# Patient Record
Sex: Female | Born: 1995 | Race: Black or African American | Hispanic: No | Marital: Single | Smoking: Never smoker
Health system: Southern US, Community
[De-identification: ages and names within clinical notes are randomized; demographics above are authoritative.]

## PROBLEM LIST (undated history)

## (undated) DIAGNOSIS — E119 Type 2 diabetes mellitus without complications: Secondary | ICD-10-CM

## (undated) HISTORY — DX: Type 2 diabetes mellitus without complications: E11.9

## (undated) HISTORY — PX: CHOLECYSTECTOMY: SHX55

---

## 2016-10-21 ENCOUNTER — Encounter (HOSPITAL_COMMUNITY): Payer: Self-pay | Admitting: Emergency Medicine

## 2016-10-21 ENCOUNTER — Ambulatory Visit (HOSPITAL_COMMUNITY)
Admission: EM | Admit: 2016-10-21 | Discharge: 2016-10-21 | Disposition: A | Discharge status: Home / Self Care | Payer: BLUE CROSS/BLUE SHIELD | Attending: Family | Admitting: Family

## 2016-10-21 DIAGNOSIS — B9689 Other specified bacterial agents as the cause of diseases classified elsewhere: Secondary | ICD-10-CM | POA: Insufficient documentation

## 2016-10-21 DIAGNOSIS — N760 Acute vaginitis: Secondary | ICD-10-CM | POA: Diagnosis not present

## 2016-10-21 DIAGNOSIS — R1030 Lower abdominal pain, unspecified: Secondary | ICD-10-CM | POA: Diagnosis present

## 2016-10-21 DIAGNOSIS — Z3202 Encounter for pregnancy test, result negative: Secondary | ICD-10-CM | POA: Diagnosis not present

## 2016-10-21 DIAGNOSIS — R102 Pelvic and perineal pain: Secondary | ICD-10-CM

## 2016-10-21 LAB — POCT URINALYSIS DIP (DEVICE)
BILIRUBIN URINE: NEGATIVE
Glucose, UA: NEGATIVE mg/dL
Ketones, ur: NEGATIVE mg/dL
NITRITE: NEGATIVE
PH: 5.5 (ref 5.0–8.0)
Protein, ur: NEGATIVE mg/dL
UROBILINOGEN UA: 0.2 mg/dL (ref 0.0–1.0)

## 2016-10-21 LAB — POCT PREGNANCY, URINE: Preg Test, Ur: NEGATIVE

## 2016-10-21 NOTE — ED Triage Notes (Signed)
The patient presented to the [facility] with a complaint of lower abdominal pain that started after the oral treatment of chlamydia. The patient reported that she is also currently taking an oral antibiotic for a pelvic abscess.

## 2016-10-21 NOTE — Discharge Instructions (Signed)
As discussed, suprapubic pain is nonspecific at this point. It is bilateral, as discussed as well, which makes me think could be polycystic ovarian syndrome which typically cyst son ovaries enlarge during menstrual cycle and become painful.  Please closely monitor symptom as discussed, if worsens in anyway or new symptoms develop, please go to  emergency room. Otherwise, please follow-up with your health clinic as discussed likely transvaginal ultrasound would be beneficial for you. I would prefer Tylenol is fine. Heat will also help symptoms as well.

## 2016-10-21 NOTE — ED Provider Notes (Signed)
[name]    CSN: [id number] Arrival date & time: 10/21/16  1222     History   Chief Complaint Chief Complaint  Patient presents with  . Abdominal Pain    HPI Michelle Lawson is a 21 y.o. female.   Chief complaint of lower abdominal pain x 3 days , worsening. Treated for chlamydia after positive at [facility] 3 days ago , treated with one time dose medication. ( unsure of name) . Pain worsened with walking. Nothing makes it better. Pain is equal on both sides of abdomen. Cramps with cycle normally in low back.   Had dysuria 3 days ago,  However thimks from chlamydia. ' which has gotten better'. No vomiting, diarrhea, fever,  Hematuria, blood in stool. Rates pain 7/10. Last BM 2 days ago. Passing gas.   ON menstrual cycle currently, started cycle about 6 days ago and cycle lasts one week. On Nuvaring.   No concerns for pregnancy. No new sexual encounters since tested at health clinic.   She also reports a cyst in vagina area which she's taking Keflex for.  Suspected PCOS years ago; patient denies the ultrasound for confirmation        History reviewed. No pertinent past medical history.  There are no active problems to display for this patient.   History reviewed. No pertinent surgical history.  OB History    No data available       Home Medications    Prior to Admission medications   Medication Sig Start Date End Date Taking? Authorizing Provider  cephALEXin (KEFLEX) 500 MG capsule Take 500 mg by mouth 4 (four) times daily.   Yes [provider]    Family History History reviewed. No pertinent family history.  Social History Social History  Substance Use Topics  . Smoking status: Never Smoker  . Smokeless tobacco: Never Used  . Alcohol use No     Allergies   Patient has no known allergies.   Review of Systems Review of Systems  Constitutional: Negative for chills and fever.  Respiratory: Negative for cough.     Cardiovascular: Negative for chest pain and palpitations.  Gastrointestinal: Positive for abdominal pain. Negative for abdominal distention, constipation, diarrhea, nausea and vomiting.  Genitourinary: Positive for vaginal bleeding. Negative for dysuria, frequency, hematuria, pelvic pain, vaginal discharge and vaginal pain.     Physical Exam Triage Vital Signs ED Triage Vitals [10/21/16 1402]  Enc Vitals Group     BP 131/80     Pulse Rate 73     Resp 16     Temp 98.3 F (36.8 C)     Temp Source Oral     SpO2 100 %     Weight      Height      Head Circumference      Peak Flow      Pain Score      Pain Loc      Pain Edu?      Excl. in [place]?    No data found.   Updated Vital Signs BP 131/80 (BP Location: Left Arm)   Pulse 73   Temp 98.3 F (36.8 C) (Oral)   Resp 16   LMP 10/21/2016 (Exact Date)   SpO2 100%   Visual Acuity Right Eye Distance:   Left Eye Distance:   Bilateral Distance:    Right Eye Near:   Left Eye Near:    Bilateral Near:     Physical Exam  Constitutional:  She appears well-developed and well-nourished.  Eyes: Conjunctivae are normal.  Cardiovascular: Normal rate, regular rhythm, normal heart sounds and normal pulses.   Pulmonary/Chest: Effort normal and breath sounds normal. She has no wheezes. She has no rhonchi. She has no rales.  Abdominal: Soft. Normal appearance and bowel sounds are normal. She exhibits no distension, no fluid wave, no ascites and no mass. There is no tenderness. There is no rigidity, no rebound, no guarding and no CVA tenderness.    Focal areas of tenderness as noted by patient and also appreciated during exam. Mild tenderness bilateral suprapubic areas as marked on diagram.   Genitourinary: There is no rash, tenderness or lesion on the right labia. There is no rash, tenderness or lesion on the left labia. Cervix exhibits no motion tenderness and no discharge. Right adnexum displays no mass, no tenderness and no fullness.  Left adnexum displays no mass, no tenderness and no fullness. There is bleeding in the vagina. No erythema or tenderness in the vagina. No foreign body in the vagina. No vaginal discharge found.  Genitourinary Comments: No vulvovaginal erythema. No lesions or evidence of abscess.  Discharge is thin and clear, not purulent. Blood seen coming from os.   Neurological: She is alert.  Skin: Skin is warm and dry.  Psychiatric: She has a normal mood and affect. Her speech is normal and behavior is normal. Thought content normal.  Vitals reviewed.    UC Treatments / Results  Labs (all labs ordered are listed, but only abnormal results are displayed) Labs Reviewed  CERVICOVAGINAL ANCILLARY ONLY    EKG  EKG Interpretation None       Radiology No results found.  Procedures Procedures (including critical care time)  Medications Ordered in UC Medications - No data to display   Initial Impression / Assessment and Plan / UC Course  I have reviewed the triage vital signs and the nursing notes.  Pertinent labs & imaging results that were available during my care of the patient were reviewed by me and considered in my medical decision making (see chart for details).      Final Clinical Impressions(s) / UC Diagnoses   Final diagnoses:  Suprapubic pain  Patient is afebrile and well-appearing. She is talking easily, no facial grimacing, she is using her phone while in the room. No obvious acute pain. Reassured by benign abdominal exam. Pain actually feels quite superficial as able to be elicited on bilateral sides of the groin area as noted on diagram. No CMT. Discussed with patient etiology pain is not specific at this point. However I suspect since it is bilateral pain may be from menstrual cramping, ovarian in nature or muscle skeletal etiology. Advised patient to go to emergency room if she thought her pain was severe, since rated pain 7/10 today;  patient politely declined at this time  and prefers to wait  to see her health clinic next week. Advised her when she follows up with the health clinic, an ultrasound would be appropriate next step to evaluate for PCOS. Patient verbalized understanding of this. Return precautions given.  Of note, call patient after she left to  discuss results of negative urine pregancy test and also blood seen in urine dipstick as well as leukocytes. Patient is not having dysuria, urinary frequency at this time, patient felt more comfortable with following up with her health team at [facility] to have urine studies performed including urine culture. She declines any antibiotic this evening in the absence of symptoms  which I advised appropriate.   New Prescriptions New Prescriptions   No medications on file     Controlled Substance Prescriptions [place] Controlled Substance Registry consulted? Not Applicable   Burnard Hawthorne,  10/21/16 [phone]

## 2016-10-23 LAB — CERVICOVAGINAL ANCILLARY ONLY
BACTERIAL VAGINITIS: POSITIVE — AB
CANDIDA VAGINITIS: NEGATIVE
Chlamydia: NEGATIVE
Neisseria Gonorrhea: NEGATIVE
Trichomonas: NEGATIVE

## 2016-10-24 ENCOUNTER — Other Ambulatory Visit: Payer: Self-pay | Admitting: Family

## 2016-10-24 DIAGNOSIS — B9689 Other specified bacterial agents as the cause of diseases classified elsewhere: Secondary | ICD-10-CM

## 2016-10-24 DIAGNOSIS — N760 Acute vaginitis: Principal | ICD-10-CM

## 2016-10-24 MED ORDER — METRONIDAZOLE 500 MG PO TABS: 500.0000 mg | ORAL_TABLET | Freq: Two times a day (BID) | ORAL | 0 refills | Status: DC

## 2017-07-10 ENCOUNTER — Ambulatory Visit (HOSPITAL_COMMUNITY)
Admission: EM | Admit: 2017-07-10 | Discharge: 2017-07-10 | Disposition: A | Discharge status: Home / Self Care | Payer: BLUE CROSS/BLUE SHIELD | Attending: Family Medicine | Admitting: Family Medicine

## 2017-07-10 ENCOUNTER — Other Ambulatory Visit: Payer: Self-pay

## 2017-07-10 ENCOUNTER — Encounter (HOSPITAL_COMMUNITY): Payer: Self-pay | Admitting: Emergency Medicine

## 2017-07-10 DIAGNOSIS — R109 Unspecified abdominal pain: Secondary | ICD-10-CM | POA: Diagnosis present

## 2017-07-10 DIAGNOSIS — Z79899 Other long term (current) drug therapy: Secondary | ICD-10-CM | POA: Insufficient documentation

## 2017-07-10 DIAGNOSIS — R197 Diarrhea, unspecified: Secondary | ICD-10-CM

## 2017-07-10 DIAGNOSIS — Z833 Family history of diabetes mellitus: Secondary | ICD-10-CM | POA: Insufficient documentation

## 2017-07-10 DIAGNOSIS — Z823 Family history of stroke: Secondary | ICD-10-CM | POA: Diagnosis not present

## 2017-07-10 NOTE — ED Provider Notes (Signed)
[place]    CSN: [id number] Arrival date & time: 07/10/17  1023     History   Chief Complaint Chief Complaint  Patient presents with  . Abdominal Pain    HPI Michelle Lawson is a 22 y.o. female.   HPI  Patient is here for diarrhea for 2-1/2 weeks.  She states is been present every day.  Every time she eats she has to probably go to the bathroom.  Does not matter what she eats.  She did not start off with any stomach flu.  She did travel recently to [place].  She was at her home visiting family.  No one else at home or at her current place of residence is sick.  She denied any food that she thought was contaminated.  She does not have any food allergies.  She denies any other travel.  She is had no nausea or vomiting.  Her weight is been stable.  She is drinking plenty of fluids.  She states the stool is loose to watery.  No mucus or blood.  No recent antibiotic use.  History reviewed. No pertinent past medical history.  There are no active problems to display for this patient.   History reviewed. No pertinent surgical history.  OB History   None      Home Medications    Prior to Admission medications   Medication Sig Start Date End Date Taking? Authorizing Provider  FLUoxetine (PROZAC) 40 MG capsule Take 40 mg by mouth daily.   Yes [provider]    Family History Family History  Problem Relation Age of Onset  . Diabetes Mother   . Stroke Mother   . Diabetes Father     Social History Social History   Tobacco Use  . Smoking status: Never Smoker  . Smokeless tobacco: Never Used  Substance Use Topics  . Alcohol use: No  . Drug use: No     Allergies   Patient has no known allergies.   Review of Systems Review of Systems  Constitutional: Negative for chills and fever.  HENT: Negative for ear pain and sore throat.   Eyes: Negative for pain and visual disturbance.  Respiratory: Negative for cough and shortness of breath.     Cardiovascular: Negative for chest pain and palpitations.  Gastrointestinal: Positive for abdominal pain and diarrhea. Negative for blood in stool, nausea and vomiting.  Genitourinary: Negative for dysuria and hematuria.  Musculoskeletal: Negative for arthralgias and back pain.  Skin: Negative for color change and rash.  Neurological: Negative for seizures and syncope.  All other systems reviewed and are negative.    Physical Exam Triage Vital Signs ED Triage Vitals  Enc Vitals Group     BP 07/10/17 1127 125/77     Pulse Rate 07/10/17 1127 67     Resp 07/10/17 1127 18     Temp 07/10/17 1127 100.1 F (37.8 C)     Temp Source 07/10/17 1127 Oral     SpO2 07/10/17 1127 98 %     Weight --      Height --      Head Circumference --      Peak Flow --      Pain Score 07/10/17 1124 2     Pain Loc --      Pain Edu? --      Excl. in [place]? --    No data found.  Updated Vital Signs BP 125/77 (BP Location: Left Arm) Comment (BP Location):  large cuff  Pulse 67   Temp 100.1 F (37.8 C) (Oral)   Resp 18   LMP 06/24/2017   SpO2 98%   Visual Acuity Right Eye Distance:   Left Eye Distance:   Bilateral Distance:    Right Eye Near:   Left Eye Near:    Bilateral Near:     Physical Exam  Constitutional: She appears well-developed and well-nourished. No distress.  HENT:  Head: Normocephalic and atraumatic.  Mouth/Throat: Oropharynx is clear and moist.  Eyes: Pupils are equal, round, and reactive to light. Conjunctivae are normal.  Neck: Normal range of motion.  Cardiovascular: Normal rate and normal heart sounds.  Pulmonary/Chest: Effort normal and breath sounds normal. No respiratory distress.  Abdominal: Soft. Normal appearance and bowel sounds are normal. She exhibits no distension. There is no hepatosplenomegaly. There is no tenderness.  Musculoskeletal: Normal range of motion. She exhibits no edema.  Neurological: She is alert.  Skin: Skin is warm and dry.  Psychiatric: She  has a normal mood and affect. Her behavior is normal.     UC Treatments / Results  Labs (all labs ordered are listed, but only abnormal results are displayed) Labs Reviewed  GASTROINTESTINAL PANEL BY PCR, STOOL (REPLACES STOOL CULTURE)    EKG None  Radiology No results found.  Procedures Procedures (including critical care time)  Medications Ordered in UC Medications - No data to display  Initial Impression / Assessment and Plan / UC Course  I have reviewed the triage vital signs and the nursing notes.  Pertinent labs & imaging results that were available during my care of the patient were reviewed by me and considered in my medical decision making (see chart for details).     Discussed diarrhea.  This is presumed infectious given the longevity.  She has tried bland diet.  She is try to increase her fluids.  She has not used any over-the-counter products.  We will do a pathogen profile.  Instructed on dietary restrictions and diarrhea management. Final Clinical Impressions(s) / UC Diagnoses   Final diagnoses:  Diarrhea of presumed infectious origin     Discharge Instructions     Drink plenty of fluids Bland diet, advance as tolerated. We did lab testing during this visit.  If there are any abnormal findings that require change in medicine or indicate a positive result, you will be notified.  If all of your tests are normal, you will not be called.   Most causes of diarrhea will resolve with conservative treatment You may use diarrhea medicine like kao pectate, imodium or pepto bismol if needed for symptom control Return if diarrhea persists or worsens.    ED Prescriptions    None     Controlled Substance Prescriptions [place] Controlled Substance Registry consulted? Not Applicable   Raylene Everts, MD 07/10/17 1845

## 2017-07-10 NOTE — ED Triage Notes (Addendum)
Poor appetite, food has no taste, lethargy, frequent napping and diarrhea (4 times a day).  Onset around may 21 of symptoms.    Patient did fly within country to [facility] the time of onset of symptoms

## 2017-07-10 NOTE — Discharge Instructions (Addendum)
Drink plenty of fluids Bland diet, advance as tolerated. We did lab testing during this visit.  If there are any abnormal findings that require change in medicine or indicate a positive result, you will be notified.  If all of your tests are normal, you will not be called.   Most causes of diarrhea will resolve with conservative treatment You may use diarrhea medicine like kao pectate, imodium or pepto bismol if needed for symptom control Return if diarrhea persists or worsens.

## 2017-07-11 LAB — GASTROINTESTINAL PANEL BY PCR, STOOL (REPLACES STOOL CULTURE)

## 2017-07-13 ENCOUNTER — Telehealth (HOSPITAL_COMMUNITY): Payer: Self-pay

## 2017-07-13 NOTE — Telephone Encounter (Signed)
Results are within normal range. Pt contacted and made aware. Verbalized understanding.   

## 2017-12-02 ENCOUNTER — Emergency Department (HOSPITAL_COMMUNITY): Payer: BLUE CROSS/BLUE SHIELD

## 2017-12-02 ENCOUNTER — Other Ambulatory Visit: Payer: Self-pay

## 2017-12-02 ENCOUNTER — Emergency Department (HOSPITAL_COMMUNITY)
Admission: EM | Admit: 2017-12-02 | Discharge: 2017-12-02 | Disposition: A | Discharge status: Home / Self Care | Payer: BLUE CROSS/BLUE SHIELD | Attending: Emergency Medicine | Admitting: Emergency Medicine

## 2017-12-02 DIAGNOSIS — J40 Bronchitis, not specified as acute or chronic: Secondary | ICD-10-CM | POA: Diagnosis not present

## 2017-12-02 DIAGNOSIS — Z79899 Other long term (current) drug therapy: Secondary | ICD-10-CM | POA: Insufficient documentation

## 2017-12-02 DIAGNOSIS — R0981 Nasal congestion: Secondary | ICD-10-CM | POA: Diagnosis present

## 2017-12-02 MED ORDER — HYDROCOD POLST-CPM POLST ER 10-8 MG/5ML PO SUER: 5.0000 mL | Freq: Every evening | ORAL | 0 refills | Status: DC | PRN

## 2017-12-02 MED ORDER — ALBUTEROL SULFATE HFA 108 (90 BASE) MCG/ACT IN AERS
1.0000 | INHALATION_SPRAY | RESPIRATORY_TRACT | Status: DC
  Administered 2017-12-02: 1 via RESPIRATORY_TRACT
  Filled 2017-12-02: qty 6.7

## 2017-12-02 MED ORDER — PREDNISONE 10 MG PO TABS: 40.0000 mg | ORAL_TABLET | Freq: Every day | ORAL | 0 refills | Status: AC

## 2017-12-02 MED ORDER — BENZONATATE 100 MG PO CAPS: 100.0000 mg | ORAL_CAPSULE | Freq: Three times a day (TID) | ORAL | 0 refills | Status: DC

## 2017-12-02 NOTE — ED Provider Notes (Signed)
[place] EMERGENCY DEPARTMENT Provider Note   CSN: [id number] Arrival date & time: 12/02/17  1929     History   Chief Complaint Chief Complaint  Patient presents with  . Nasal Congestion    HPI Michelle Lawson is a 22 y.o. female ending for evaluation of cough and chest tightness.  Patient states for the past month, she has been having cold like symptoms.  Symptoms initially began with nasal congestion, fevers up to 101, sore throat, and productive cough.  Symptoms improved with time and Mucinex.  However, the past week, symptoms have worsened again.  Patient reports persistent dry cough.  She has associated chest tightness.  She reports nasal congestion, mostly at night.  She denies current fevers, chills, ear pain, eye pain, sore throat, chest pain, nausea, vomiting, abdominal pain.  She denies history of asthma or COPD.  She does not smoke cigarettes.  She denies sick contacts.  She has no other medical problems, takes no medications daily.  HPI  No past medical history on file.  There are no active problems to display for this patient.   No past surgical history on file.   OB History   None      Home Medications    Prior to Admission medications   Medication Sig Start Date End Date Taking? Authorizing Provider  benzonatate (TESSALON) 100 MG capsule Take 1 capsule (100 mg total) by mouth every 8 (eight) hours. 12/02/17   Eliasar Hlavaty, PA-C  chlorpheniramine-HYDROcodone (TUSSIONEX PENNKINETIC ER) 10-8 MG/5ML SUER Take 5 mLs by mouth at bedtime as needed for cough. 12/02/17   Marquee Fuchs, PA-C  FLUoxetine (PROZAC) 40 MG capsule Take 40 mg by mouth daily.    [provider]  predniSONE (DELTASONE) 10 MG tablet Take 4 tablets (40 mg total) by mouth daily for 5 days. 12/02/17 12/07/17  Emoni Yang, PA-C    Family History Family History  Problem Relation Age of Onset  . Diabetes Mother   . Stroke Mother   . Diabetes Father      Social History Social History   Tobacco Use  . Smoking status: Never Smoker  . Smokeless tobacco: Never Used  Substance Use Topics  . Alcohol use: No  . Drug use: No     Allergies   Patient has no known allergies.   Review of Systems Review of Systems  HENT: Positive for congestion.   Respiratory: Positive for cough and chest tightness.   Cardiovascular: Negative for chest pain.     Physical Exam Updated Vital Signs BP 120/70 (BP Location: Right Arm)   Pulse 89   Temp 98.7 F (37.1 C) (Oral)   Resp 16   Wt 104.3 kg   LMP 11/14/2017   SpO2 96%   Physical Exam  Constitutional: She is oriented to person, place, and time. She appears well-developed and well-nourished. No distress.  HENT:  Head: Normocephalic and atraumatic.  Right Ear: Tympanic membrane, external ear and ear canal normal.  Left Ear: Tympanic membrane, external ear and ear canal normal.  Nose: Mucosal edema present. Right sinus exhibits no maxillary sinus tenderness and no frontal sinus tenderness. Left sinus exhibits no maxillary sinus tenderness and no frontal sinus tenderness.  Mouth/Throat: Uvula is midline, oropharynx is clear and moist and mucous membranes are normal. No tonsillar exudate.  Nasal mucosal edema.  No tenderness palpation of the sinuses.  OP clear without tonsillar swelling or exudate.  Uvula midline with equal palate rise.  TMs nonerythematous and  nonbulging bilaterally.  Eyes: Pupils are equal, round, and reactive to light. Conjunctivae and EOM are normal.  Neck: Normal range of motion.  Cardiovascular: Normal rate, regular rhythm and intact distal pulses.  Pulmonary/Chest: Effort normal. She has no decreased breath sounds. She has wheezes. She has no rhonchi. She has no rales.  Pt speaking in full sentences without difficulty. Mild scattered expiratory wheeze  Abdominal: Soft. She exhibits no distension. There is no tenderness.  Musculoskeletal: Normal range of motion.   Lymphadenopathy:    She has no cervical adenopathy.  Neurological: She is alert and oriented to person, place, and time.  Skin: Skin is warm. Capillary refill takes less than 2 seconds.  Psychiatric: She has a normal mood and affect.  Nursing note and vitals reviewed.    ED Treatments / Results  Labs (all labs ordered are listed, but only abnormal results are displayed) Labs Reviewed - No data to display  EKG None  Radiology Dg Chest 2 View  Result Date: 12/02/2017 CLINICAL DATA:  Recurring cold symptoms. EXAM: CHEST - 2 VIEW COMPARISON:  None. FINDINGS: The heart size and mediastinal contours are within normal limits. Both lungs are clear. The visualized skeletal structures are unremarkable. IMPRESSION: No active cardiopulmonary disease. Electronically Signed   By: Dorise Bullion III M.D   On: 12/02/2017 20:49    Procedures Procedures (including critical care time)  Medications Ordered in ED Medications  albuterol (PROVENTIL HFA;VENTOLIN HFA) 108 (90 Base) MCG/ACT inhaler 1 puff (1 puff Inhalation Given 12/02/17 2206)     Initial Impression / Assessment and Plan / ED Course  I have reviewed the triage vital signs and the nursing notes.  Pertinent labs & imaging results that were available during my care of the patient were reviewed by me and considered in my medical decision making (see chart for details).     Patient presenting with 1 month h/o URI symptoms.  Physical exam reassuring, patient is afebrile and appears nontoxic.  Pulmonary exam with mild scattered wheezing, but otherwise reassuring.  Doubt pneumonia, strep, sinusitis, other bacterial infection, or peritonsillar abscess. CXR viewed and interpreted by me, no fx or dislocation.  Likely bronchitis.  Will treat with inhaler, steroids, antitussives.  Discussed findings and plan with patient.  Discussed follow-up as needed.  Patient states that she is getting sick very frequently, almost once a month.  Suggested  follow-up with ENT once she is feeling better for evaluation of sinuses/adenoids/tonsils. At this time, patient appears safe for discharge.  Return precautions given.  Patient states she understands and agrees to plan.   Final Clinical Impressions(s) / ED Diagnoses   Final diagnoses:  Bronchitis    ED Discharge Orders         Ordered    predniSONE (DELTASONE) 10 MG tablet  Daily     12/02/17 2140    benzonatate (TESSALON) 100 MG capsule  Every 8 hours     12/02/17 2140    chlorpheniramine-HYDROcodone (TUSSIONEX PENNKINETIC ER) 10-8 MG/5ML SUER  At bedtime PRN     12/02/17 2140           Franchot Heidelberg, PA-C 12/02/17 2212    Virgel Manifold, MD 12/02/17 2356

## 2017-12-02 NOTE — ED Notes (Signed)
Patient verbalizes understanding of discharge instructions. Opportunity for questioning and answers were provided. Armband removed by staff, pt discharged from ED ambulatory.   

## 2017-12-02 NOTE — ED Triage Notes (Signed)
Patient c/o being sick at least once a month. States having recurring cold symptoms that linger. C/o cough (non-productive).

## 2017-12-02 NOTE — Discharge Instructions (Signed)
Use albuterol inhaler every 4 hours for the next 2 days.  After that, only as needed for chest tightness or shortness of breath. Use cough syrup and drops as needed for coughing. Take prednisone as prescribed. Make sure you are staying well-hydrated with water. Avoid things that irritate your lungs, such as excessive exercise, smoke, and other inhalants. You may follow-up with ENT as needed for evaluation of your frequent illnesses. Return to the emergency room with any new, worsening, concerning symptoms.

## 2018-06-15 ENCOUNTER — Other Ambulatory Visit: Payer: Self-pay

## 2018-06-15 ENCOUNTER — Encounter (HOSPITAL_COMMUNITY): Payer: Self-pay | Admitting: *Deleted

## 2018-06-15 ENCOUNTER — Inpatient Hospital Stay (HOSPITAL_COMMUNITY)
Admission: EM | Admit: 2018-06-15 | Discharge: 2018-06-17 | DRG: 419 | Disposition: A | Discharge status: Home / Self Care | Payer: BLUE CROSS/BLUE SHIELD | Attending: Physician Assistant | Admitting: Physician Assistant

## 2018-06-15 ENCOUNTER — Emergency Department (HOSPITAL_COMMUNITY): Payer: BLUE CROSS/BLUE SHIELD

## 2018-06-15 DIAGNOSIS — Z6838 Body mass index (BMI) 38.0-38.9, adult: Secondary | ICD-10-CM

## 2018-06-15 DIAGNOSIS — K8067 Calculus of gallbladder and bile duct with acute and chronic cholecystitis with obstruction: Secondary | ICD-10-CM | POA: Diagnosis present

## 2018-06-15 DIAGNOSIS — K8020 Calculus of gallbladder without cholecystitis without obstruction: Secondary | ICD-10-CM | POA: Diagnosis present

## 2018-06-15 DIAGNOSIS — D134 Benign neoplasm of liver: Secondary | ICD-10-CM | POA: Diagnosis present

## 2018-06-15 DIAGNOSIS — K829 Disease of gallbladder, unspecified: Secondary | ICD-10-CM | POA: Diagnosis not present

## 2018-06-15 DIAGNOSIS — Z833 Family history of diabetes mellitus: Secondary | ICD-10-CM | POA: Diagnosis not present

## 2018-06-15 DIAGNOSIS — Z79899 Other long term (current) drug therapy: Secondary | ICD-10-CM | POA: Diagnosis not present

## 2018-06-15 DIAGNOSIS — Z1159 Encounter for screening for other viral diseases: Secondary | ICD-10-CM | POA: Diagnosis not present

## 2018-06-15 DIAGNOSIS — Z823 Family history of stroke: Secondary | ICD-10-CM

## 2018-06-15 DIAGNOSIS — R9389 Abnormal findings on diagnostic imaging of other specified body structures: Secondary | ICD-10-CM

## 2018-06-15 DIAGNOSIS — K8050 Calculus of bile duct without cholangitis or cholecystitis without obstruction: Secondary | ICD-10-CM

## 2018-06-15 DIAGNOSIS — K219 Gastro-esophageal reflux disease without esophagitis: Secondary | ICD-10-CM | POA: Diagnosis present

## 2018-06-15 DIAGNOSIS — R1013 Epigastric pain: Secondary | ICD-10-CM | POA: Diagnosis present

## 2018-06-15 LAB — COMPREHENSIVE METABOLIC PANEL
ALT: 19 U/L (ref 0–44)
AST: 26 U/L (ref 15–41)
Albumin: 3.7 g/dL (ref 3.5–5.0)
Alkaline Phosphatase: 55 U/L (ref 38–126)
Anion gap: 15 (ref 5–15)
BUN: 10 mg/dL (ref 6–20)
CO2: 20 mmol/L — ABNORMAL LOW (ref 22–32)
Calcium: 9.3 mg/dL (ref 8.9–10.3)
Chloride: 102 mmol/L (ref 98–111)
Creatinine, Ser: 0.58 mg/dL (ref 0.44–1.00)
GFR calc Af Amer: >60 mL/min (ref >60)
GFR calc non Af Amer: >60 mL/min (ref >60)
Glucose, Bld: 174 mg/dL — ABNORMAL HIGH (ref 70–99)
Potassium: 3.6 mmol/L (ref 3.5–5.1)
Sodium: 137 mmol/L (ref 135–145)
Total Bilirubin: 0.4 mg/dL (ref 0.3–1.2)
Total Protein: 6.9 g/dL (ref 6.5–8.1)

## 2018-06-15 LAB — CBC
HCT: 37.4 % (ref 36.0–46.0)
Hemoglobin: 11.8 g/dL — ABNORMAL LOW (ref 12.0–15.0)
MCH: 26.3 pg (ref 26.0–34.0)
MCHC: 31.6 g/dL (ref 30.0–36.0)
MCV: 83.3 fL (ref 80.0–100.0)
Platelets: 333 10*3/uL (ref 150–400)
RBC: 4.49 MIL/uL (ref 3.87–5.11)
RDW: 12.8 % (ref 11.5–15.5)
WBC: 14.4 10*3/uL — ABNORMAL HIGH (ref 4.0–10.5)
nRBC: 0 % (ref 0.0–0.2)

## 2018-06-15 LAB — SARS CORONAVIRUS 2 BY RT PCR (HOSPITAL ORDER, PERFORMED IN ~~LOC~~ HOSPITAL LAB): SARS Coronavirus 2: NEGATIVE

## 2018-06-15 LAB — PREGNANCY, URINE: Preg Test, Ur: NEGATIVE

## 2018-06-15 LAB — I-STAT TROPONIN, ED: Troponin i, poc: 0 ng/mL (ref 0.00–0.08)

## 2018-06-15 LAB — LIPASE, BLOOD: Lipase: 29 U/L (ref 11–51)

## 2018-06-15 MED ORDER — DEXTROSE-NACL 5-0.9 % IV SOLN
INTRAVENOUS | Status: DC
  Administered 2018-06-15 – 2018-06-16 (×2): via INTRAVENOUS

## 2018-06-15 MED ORDER — SODIUM CHLORIDE 0.9 % IV BOLUS
1000.0000 mL | Freq: Once | INTRAVENOUS | Status: AC
  Administered 2018-06-15: 1000 mL via INTRAVENOUS

## 2018-06-15 MED ORDER — ONDANSETRON HCL 4 MG/2ML IJ SOLN
4.0000 mg | Freq: Once | INTRAMUSCULAR | Status: AC
  Administered 2018-06-15: 4 mg via INTRAVENOUS
  Filled 2018-06-15: qty 2

## 2018-06-15 MED ORDER — METOPROLOL TARTRATE 5 MG/5ML IV SOLN: 5.0000 mg | Freq: Four times a day (QID) | INTRAVENOUS | Status: DC | PRN

## 2018-06-15 MED ORDER — ONDANSETRON 4 MG PO TBDP: 4.0000 mg | ORAL_TABLET | Freq: Four times a day (QID) | ORAL | Status: DC | PRN

## 2018-06-15 MED ORDER — ONDANSETRON HCL 4 MG/2ML IJ SOLN: 4.0000 mg | Freq: Four times a day (QID) | INTRAMUSCULAR | Status: DC | PRN

## 2018-06-15 MED ORDER — HYDROMORPHONE HCL 1 MG/ML IJ SOLN
1.0000 mg | Freq: Once | INTRAMUSCULAR | Status: AC
  Administered 2018-06-15: 1 mg via INTRAVENOUS
  Filled 2018-06-15: qty 1

## 2018-06-15 MED ORDER — HYDROMORPHONE HCL 1 MG/ML IJ SOLN
1.0000 mg | INTRAMUSCULAR | Status: DC | PRN
  Administered 2018-06-15: 1 mg via INTRAVENOUS
  Filled 2018-06-15 (×2): qty 1

## 2018-06-15 MED ORDER — PIPERACILLIN-TAZOBACTAM 3.375 G IVPB
3.3750 g | Freq: Three times a day (TID) | INTRAVENOUS | Status: DC
  Administered 2018-06-15 – 2018-06-16 (×4): 3.375 g via INTRAVENOUS
  Filled 2018-06-15: qty 50

## 2018-06-15 NOTE — Plan of Care (Signed)

## 2018-06-15 NOTE — ED Notes (Signed)
Patient transported to Ultrasound 

## 2018-06-15 NOTE — ED Provider Notes (Addendum)
[facility] EMERGENCY DEPARTMENT Provider Note   CSN: [id number] Arrival date & time: 06/15/18  5784    History   Chief Complaint Chief Complaint  Patient presents with  . Abdominal Pain  . Chest Pain  . Back Pain    HPI Michelle Lawson is a 23 y.o. female.     Patient c/o epigastric pain, midscapular back pain, lower chest pain, onset last pm at [place], at rest. Pain acute onset, moderate/sev, constant, persistent. No specific exacerbating or alleviating factors. No hx same pain. A few hours later developed nv, emesis clear not bloody or bilious. No diarrhea or constipation. No abd distension. No hx same pain. No personal or fam hx gallstones. No fam hx premature cad. No hx pancreatitis or pud. No fever or chills. No cough or uri symptoms.   The history is provided by the patient.  Abdominal Pain  Associated symptoms: chest pain, nausea and vomiting   Associated symptoms: no chills, no constipation, no cough, no diarrhea, no dysuria, no fever, no shortness of breath, no sore throat, no vaginal bleeding and no vaginal discharge   Chest Pain  Associated symptoms: abdominal pain, back pain, nausea and vomiting   Associated symptoms: no cough, no fever, no headache and no shortness of breath   Back Pain  Associated symptoms: abdominal pain and chest pain   Associated symptoms: no dysuria, no fever and no headaches     History reviewed. No pertinent past medical history.  There are no active problems to display for this patient.   History reviewed. No pertinent surgical history.   OB History   No obstetric history on file.      Home Medications    Prior to Admission medications   Medication Sig Start Date End Date Taking? Authorizing Provider  benzonatate (TESSALON) 100 MG capsule Take 1 capsule (100 mg total) by mouth every 8 (eight) hours. 12/02/17   Caccavale, Sophia, PA-C  chlorpheniramine-HYDROcodone (TUSSIONEX PENNKINETIC ER) 10-8 MG/5ML SUER Take  5 mLs by mouth at bedtime as needed for cough. 12/02/17   Caccavale, Sophia, PA-C  FLUoxetine (PROZAC) 40 MG capsule Take 40 mg by mouth daily.    [provider]    Family History Family History  Problem Relation Age of Onset  . Diabetes Mother   . Stroke Mother   . Diabetes Father     Social History Social History   Tobacco Use  . Smoking status: Never Smoker  . Smokeless tobacco: Never Used  Substance Use Topics  . Alcohol use: Yes  . Drug use: No     Allergies   Patient has no known allergies.   Review of Systems Review of Systems  Constitutional: Negative for chills and fever.  HENT: Negative for sore throat.   Eyes: Negative for redness.  Respiratory: Negative for cough and shortness of breath.   Cardiovascular: Positive for chest pain.  Gastrointestinal: Positive for abdominal pain, nausea and vomiting. Negative for constipation and diarrhea.  Endocrine: Negative for polyuria.  Genitourinary: Negative for dysuria, flank pain, vaginal bleeding and vaginal discharge.  Musculoskeletal: Positive for back pain. Negative for neck pain.  Skin: Negative for rash.  Neurological: Negative for headaches.  Hematological: Does not bruise/bleed easily.  Psychiatric/Behavioral: Negative for confusion.     Physical Exam Updated Vital Signs BP 136/79 (BP Location: Right Arm)   Pulse (!) 105   Temp 98 F (36.7 C) (Oral)   Resp 18   Ht 1.702 m (5\' 7" )  Wt 111.1 kg   LMP 05/15/2018 (Approximate)   SpO2 99%   BMI 38.37 kg/m   Physical Exam Vitals signs and nursing note reviewed.  Constitutional:      Appearance: Normal appearance. She is well-developed.  HENT:     Head: Atraumatic.     Nose: Nose normal.     Mouth/Throat:     Mouth: Mucous membranes are moist.  Eyes:     General: No scleral icterus.    Conjunctiva/sclera: Conjunctivae normal.  Neck:     Musculoskeletal: Normal range of motion and neck supple. No neck rigidity or muscular tenderness.      Trachea: No tracheal deviation.  Cardiovascular:     Rate and Rhythm: Normal rate and regular rhythm.     Pulses: Normal pulses.     Heart sounds: Normal heart sounds. No murmur. No friction rub. No gallop.   Pulmonary:     Effort: Pulmonary effort is normal. No respiratory distress.     Breath sounds: Normal breath sounds.  Abdominal:     General: Bowel sounds are normal. There is no distension.     Palpations: Abdomen is soft. There is no mass.     Tenderness: There is no abdominal tenderness. There is no guarding or rebound.     Hernia: No hernia is present.  Genitourinary:    Comments: No cva tenderness.  Musculoskeletal:        General: No swelling.  Skin:    General: Skin is warm and dry.     Findings: No rash.  Neurological:     Mental Status: She is alert.     Comments: Alert, speech normal.   Psychiatric:        Mood and Affect: Mood normal.      ED Treatments / Results  Labs (all labs ordered are listed, but only abnormal results are displayed) Results for orders placed or performed during the hospital encounter of 06/15/18  CBC  Result Value Ref Range   WBC 14.4 (H) 4.0 - 10.5 K/uL   RBC 4.49 3.87 - 5.11 MIL/uL   Hemoglobin 11.8 (L) 12.0 - 15.0 g/dL   HCT 37.4 36.0 - 46.0 %   MCV 83.3 80.0 - 100.0 fL   MCH 26.3 26.0 - 34.0 pg   MCHC 31.6 30.0 - 36.0 g/dL   RDW 12.8 11.5 - 15.5 %   Platelets 333 150 - 400 K/uL   nRBC 0.0 0.0 - 0.2 %  Comprehensive metabolic panel  Result Value Ref Range   Sodium 137 135 - 145 mmol/L   Potassium 3.6 3.5 - 5.1 mmol/L   Chloride 102 98 - 111 mmol/L   CO2 20 (L) 22 - 32 mmol/L   Glucose, Bld 174 (H) 70 - 99 mg/dL   BUN 10 6 - 20 mg/dL   Creatinine, Ser 0.58 0.44 - 1.00 mg/dL   Calcium 9.3 8.9 - 10.3 mg/dL   Total Protein 6.9 6.5 - 8.1 g/dL   Albumin 3.7 3.5 - 5.0 g/dL   AST 26 15 - 41 U/L   ALT 19 0 - 44 U/L   Alkaline Phosphatase 55 38 - 126 U/L   Total Bilirubin 0.4 0.3 - 1.2 mg/dL   GFR calc non Af Amer >60  >60 mL/min   GFR calc Af Amer >60 >60 mL/min   Anion gap 15 5 - 15  Lipase, blood  Result Value Ref Range   Lipase 29 11 - 51 U/L  I-stat troponin, ED  Result Value Ref Range   Troponin i, poc 0.00 0.00 - 0.08 ng/mL   Comment 3           US Abdomen Limited  Result Date: 06/15/2018 CLINICAL DATA:  Upper abdominal and back pain. Rule out gallstones or gallbladder disease. Initial encounter EXAM: ULTRASOUND ABDOMEN LIMITED RIGHT UPPER QUADRANT COMPARISON:  None. FINDINGS: Gallbladder: Multiple shadowing mobile stones are present within the gallbladder. The largest measures 1.1 cm. There is no gallbladder wall thickening or sonographic Murphy sign to suggest acute cholecystitis. Common bile duct: Diameter: The common bile duct is dilated up to 10 mm. Possible 1.2 cm stone is present in the common bile duct at the level the pancreas. The area is difficult to see well due to patient body habitus. This may explain the duct dilation. Liver: The liver is diffusely hyperechoic. No focal lesions are present. A poorly defined area of relative sparing is noted in the left lobe. Portal vein is patent on color Doppler imaging with normal direction of blood flow towards the liver. IMPRESSION: 1. Dilated common bile duct with probable obstructing distal stone. 2. Multiple additional shadowing stones evident within the gallbladder without evidence for acute cholecystitis. Electronically Signed   By: San Morelle M.D.   On: 06/15/2018 08:13    EKG EKG Interpretation  Date/Time:  Saturday Jun 15 2018 06:48:52 EDT Ventricular Rate:  105 PR Interval:    QRS Duration: 96 QT Interval:  377 QTC Calculation: 499 R Axis:   27 Text Interpretation:  Sinus tachycardia Prolonged QT interval No previous tracing Confirmed by Lajean Saver [phone]) on 06/15/2018 6:53:58 AM   Radiology US Abdomen Limited  Result Date: 06/15/2018 CLINICAL DATA:  Upper abdominal and back pain. Rule out gallstones or gallbladder  disease. Initial encounter EXAM: ULTRASOUND ABDOMEN LIMITED RIGHT UPPER QUADRANT COMPARISON:  None. FINDINGS: Gallbladder: Multiple shadowing mobile stones are present within the gallbladder. The largest measures 1.1 cm. There is no gallbladder wall thickening or sonographic Murphy sign to suggest acute cholecystitis. Common bile duct: Diameter: The common bile duct is dilated up to 10 mm. Possible 1.2 cm stone is present in the common bile duct at the level the pancreas. The area is difficult to see well due to patient body habitus. This may explain the duct dilation. Liver: The liver is diffusely hyperechoic. No focal lesions are present. A poorly defined area of relative sparing is noted in the left lobe. Portal vein is patent on color Doppler imaging with normal direction of blood flow towards the liver. IMPRESSION: 1. Dilated common bile duct with probable obstructing distal stone. 2. Multiple additional shadowing stones evident within the gallbladder without evidence for acute cholecystitis. Electronically Signed   By: San Morelle M.D.   On: 06/15/2018 08:13    Procedures Procedures (including critical care time)  Medications Ordered in ED Medications - No data to display   Initial Impression / Assessment and Plan / ED Course  I have reviewed the triage vital signs and the nursing notes.  Pertinent labs & imaging results that were available during my care of the patient were reviewed by me and considered in my medical decision making (see chart for details).  Iv ns bolus. Stat labs sent. Ecg. Ultrasound ordered.   Dilaudid 1 mg iv.   Reviewed nursing notes and prior charts for additional history.   Labs reviewed by me - LFTS normal, lipase normal.  Ultrasound reviewed by me - gallstones, ?dil cbd.   Pt requests nausea medication. zofran  iv.   Recheck pain improved but persists. abd soft nt.  General surgery consulted re persistent pain, gallstones, possible cbg stone -  they will see in ED.    Final Clinical Impressions(s) / ED Diagnoses   Final diagnoses:  None    ED Discharge Orders    None       Lajean Saver, MD 06/15/18 9355    Lajean Saver, MD 06/15/18 1133

## 2018-06-15 NOTE — H&P (Signed)
Michelle Lawson is an 23 y.o. female.   Chief Complaint: Abdominal pain HPI: Patient is a 23 year old female with a history of morbid obesity, who comes in with epigastric/right upper quadrant abdominal pain since midnight.  Patient states this was associated with nausea and vomiting.  She has had 2 episodes of emesis.  Patient states that this was her first episode of pain.  Secondary to unrelenting pain patient proceeded to the ER for further evaluation and management.  Upon evaluation in the ER patient underwent ultrasound and laboratory studies.  Ultrasound does show gallstones within the gallbladder as well as a possible 1.2 cm common bile duct stone.  Patient had elevated WBC count however LFTs were within normal limits.  I did review the studies personally.  History reviewed. No pertinent past medical history.  History reviewed. No pertinent surgical history.  Family History  Problem Relation Age of Onset  . Diabetes Mother   . Stroke Mother   . Diabetes Father    Social History:  reports that she has never smoked. She has never used smokeless tobacco. She reports current alcohol use. She reports that she does not use drugs.  Allergies: No Known Allergies  (Not in a hospital admission)   Results for orders placed or performed during the hospital encounter of 06/15/18 (from the past 48 hour(s))  CBC     Status: Abnormal   Collection Time: 06/15/18  7:00 AM  Result Value Ref Range   WBC 14.4 (H) 4.0 - 10.5 K/uL   RBC 4.49 3.87 - 5.11 MIL/uL   Hemoglobin 11.8 (L) 12.0 - 15.0 g/dL   HCT 37.4 36.0 - 46.0 %   MCV 83.3 80.0 - 100.0 fL   MCH 26.3 26.0 - 34.0 pg   MCHC 31.6 30.0 - 36.0 g/dL   RDW 12.8 11.5 - 15.5 %   Platelets 333 150 - 400 K/uL   nRBC 0.0 0.0 - 0.2 %    Comment: Performed at [facility], [place] [address] [place], [place] [address]  Comprehensive metabolic panel     Status: Abnormal   Collection Time: 06/15/18  7:00 AM  Result Value Ref Range   Sodium 137  135 - 145 mmol/L   Potassium 3.6 3.5 - 5.1 mmol/L   Chloride 102 98 - 111 mmol/L   CO2 20 (L) 22 - 32 mmol/L   Glucose, Bld 174 (H) 70 - 99 mg/dL   BUN 10 6 - 20 mg/dL   Creatinine, Ser 0.58 0.44 - 1.00 mg/dL   Calcium 9.3 8.9 - 10.3 mg/dL   Total Protein 6.9 6.5 - 8.1 g/dL   Albumin 3.7 3.5 - 5.0 g/dL   AST 26 15 - 41 U/L   ALT 19 0 - 44 U/L   Alkaline Phosphatase 55 38 - 126 U/L   Total Bilirubin 0.4 0.3 - 1.2 mg/dL   GFR calc non Af Amer >60 >60 mL/min   GFR calc Af Amer >60 >60 mL/min   Anion gap 15 5 - 15    Comment: Performed at [facility] [address], [place], [place] [address]  Lipase, blood     Status: None   Collection Time: 06/15/18  7:00 AM  Result Value Ref Range   Lipase 29 11 - 51 U/L    Comment: Performed at [facility], [place] [address], [place], [place] [address]  I-stat troponin, ED     Status: None   Collection Time: 06/15/18  7:09  AM  Result Value Ref Range   Troponin i, poc 0.00 0.00 - 0.08 ng/mL   Comment 3            Comment: Due to the release kinetics of cTnI, a negative result within the first hours of the onset of symptoms does not rule out myocardial infarction with certainty. If myocardial infarction is still suspected, repeat the test at appropriate intervals.    US Abdomen Limited  Result Date: 06/15/2018 CLINICAL DATA:  Upper abdominal and back pain. Rule out gallstones or gallbladder disease. Initial encounter EXAM: ULTRASOUND ABDOMEN LIMITED RIGHT UPPER QUADRANT COMPARISON:  None. FINDINGS: Gallbladder: Multiple shadowing mobile stones are present within the gallbladder. The largest measures 1.1 cm. There is no gallbladder wall thickening or sonographic Murphy sign to suggest acute cholecystitis. Common bile duct: Diameter: The common bile duct is dilated up to 10 mm. Possible 1.2 cm stone is present in the common bile duct at the level the pancreas. The area is difficult to see well due to patient body habitus. This may  explain the duct dilation. Liver: The liver is diffusely hyperechoic. No focal lesions are present. A poorly defined area of relative sparing is noted in the left lobe. Portal vein is patent on color Doppler imaging with normal direction of blood flow towards the liver. IMPRESSION: 1. Dilated common bile duct with probable obstructing distal stone. 2. Multiple additional shadowing stones evident within the gallbladder without evidence for acute cholecystitis. Electronically Signed   By: San Morelle M.D.   On: 06/15/2018 08:13    Review of Systems  Constitutional: Negative for weight loss.  HENT: Negative for ear discharge, ear pain, hearing loss and tinnitus.   Eyes: Negative for blurred vision, double vision, photophobia and pain.  Respiratory: Negative for cough, sputum production and shortness of breath.   Cardiovascular: Negative for chest pain.  Gastrointestinal: Positive for abdominal pain, nausea and vomiting.  Genitourinary: Negative for dysuria, flank pain, frequency and urgency.  Musculoskeletal: Negative for back pain, falls, joint pain, myalgias and neck pain.  Neurological: Negative for dizziness, tingling, sensory change, focal weakness, loss of consciousness and headaches.  Endo/Heme/Allergies: Does not bruise/bleed easily.  Psychiatric/Behavioral: Negative for depression, memory loss and substance abuse. The patient is not nervous/anxious.     Blood pressure 110/82, pulse (!) 101, temperature 98 F (36.7 C), temperature source Oral, resp. rate 18, height 5\' 7"  (1.702 m), weight 111.1 kg, last menstrual period 05/15/2018, SpO2 98 %. Physical Exam  Constitutional: She is oriented to person, place, and time. Vital signs are normal. She appears well-developed and well-nourished.  Conversant No acute distress  HENT:  Head: Normocephalic and atraumatic.  Eyes: Pupils are equal, round, and reactive to light. Lids are normal. No scleral icterus.  No lid lag Moist  conjunctiva  Neck: No tracheal tenderness present. No thyromegaly present.  No cervical lymphadenopathy  Cardiovascular: Normal rate, regular rhythm and intact distal pulses.  No murmur heard. Respiratory: Effort normal and breath sounds normal. She has no wheezes. She has no rales.  GI: Soft. Bowel sounds are normal. She exhibits no distension. There is no hepatosplenomegaly. There is abdominal tenderness in the right upper quadrant. There is no rebound and no guarding. No hernia.  Musculoskeletal: Normal range of motion.  Neurological: She is alert and oriented to person, place, and time.  Normal gait and station  Skin: Skin is warm. No rash noted. No cyanosis. Nails show no clubbing.  Normal skin turgor  Psychiatric: Judgment normal.  Appropriate affect     Assessment/Plan 23 year old female with cholelithiasis Choledocholithiasis  1.  We will admit to the hospital, keep n.p.o. 2.  We will place her on Zosyn 3.  We will consult GI for possible MRCP versus ERCP. 4.  Patient will likely require cholecystectomy on this hospitalization.  Ralene Ok, MD 06/15/2018, 11:44 AM

## 2018-06-15 NOTE — Plan of Care (Signed)
  Problem: Coping: Goal: Level of anxiety will decrease Outcome: Progressing   Problem: Pain Managment: Goal: General experience of comfort will improve Outcome: Progressing   Problem: Safety: Goal: Ability to remain free from injury will improve Outcome: Progressing   Problem: Skin Integrity: Goal: Risk for impaired skin integrity will decrease Outcome: Progressing   

## 2018-06-15 NOTE — ED Notes (Signed)
Pt talking to family member on the phone when I rounded on her.

## 2018-06-15 NOTE — ED Triage Notes (Signed)
States she started back pain 51mn then she developed epigastric pain and states she vomited several times. States she felt a little better after vomiting however the pain then returned.

## 2018-06-15 NOTE — ED Notes (Signed)
ED TO INPATIENT HANDOFF REPORT  ED Nurse Name and Phone #: [phone]  S Name/Age/Gender Michelle Lawson 23 y.o. female Room/Bed: 026C/026C  Code Status   Code Status: Not on file  Home/SNF/Other Home Patient oriented to: self, place, time and situation Is this baseline? Yes   Triage Complete: Triage complete  Chief Complaint back and chest pain  Triage Note States she started back pain 11mn then she developed epigastric pain and states she vomited several times. States she felt a little better after vomiting however the pain then returned.    Allergies No Known Allergies  Level of Care/Admitting Diagnosis ED Disposition    ED Disposition Condition [facility] Area: [facility] [place] [100100]  Level of Care: Med-Surg [16]  Covid Evaluation: Screening Protocol (No Symptoms)  Diagnosis: Gallstones [858850]  Admitting Physician: CCS, [place]  Attending Physician: CCS, MD [[id number]]  Estimated length of stay: past midnight tomorrow  Certification:: I certify this patient will need inpatient services for at least 2 midnights  Bed request comments: 6N  PT Class (Do Not Modify): Inpatient [101]  PT Acc Code (Do Not Modify): Private [1]       B Medical/Surgery History History reviewed. No pertinent past medical history. History reviewed. No pertinent surgical history.   A IV Location/Drains/Wounds Patient Lines/Drains/Airways Status   Active Line/Drains/Airways    Name:   Placement date:   Placement time:   Site:   Days:   Peripheral IV 06/15/18 Right Forearm   06/15/18    0702    Forearm   less than 1          Intake/Output Last 24 hours No intake or output data in the 24 hours ending 06/15/18 1207  Labs/Imaging Results for orders placed or performed during the hospital encounter of 06/15/18 (from the past 48 hour(s))  CBC     Status: Abnormal   Collection Time: 06/15/18  7:00 AM  Result Value Ref Range   WBC 14.4 (H) 4.0 - 10.5  K/uL   RBC 4.49 3.87 - 5.11 MIL/uL   Hemoglobin 11.8 (L) 12.0 - 15.0 g/dL   HCT 37.4 36.0 - 46.0 %   MCV 83.3 80.0 - 100.0 fL   MCH 26.3 26.0 - 34.0 pg   MCHC 31.6 30.0 - 36.0 g/dL   RDW 12.8 11.5 - 15.5 %   Platelets 333 150 - 400 K/uL   nRBC 0.0 0.0 - 0.2 %    Comment: Performed at [facility], [place] [address], [place], [place] [address]  Comprehensive metabolic panel     Status: Abnormal   Collection Time: 06/15/18  7:00 AM  Result Value Ref Range   Sodium 137 135 - 145 mmol/L   Potassium 3.6 3.5 - 5.1 mmol/L   Chloride 102 98 - 111 mmol/L   CO2 20 (L) 22 - 32 mmol/L   Glucose, Bld 174 (H) 70 - 99 mg/dL   BUN 10 6 - 20 mg/dL   Creatinine, Ser 0.58 0.44 - 1.00 mg/dL   Calcium 9.3 8.9 - 10.3 mg/dL   Total Protein 6.9 6.5 - 8.1 g/dL   Albumin 3.7 3.5 - 5.0 g/dL   AST 26 15 - 41 U/L   ALT 19 0 - 44 U/L   Alkaline Phosphatase 55 38 - 126 U/L   Total Bilirubin 0.4 0.3 - 1.2 mg/dL   GFR calc non Af Amer >60 >60 mL/min   GFR calc Af Amer >60 >60  mL/min   Anion gap 15 5 - 15    Comment: Performed at [facility] [address], [place], [place] [address]  Lipase, blood     Status: None   Collection Time: 06/15/18  7:00 AM  Result Value Ref Range   Lipase 29 11 - 51 U/L    Comment: Performed at [facility], [place] [address] [place],  [address]  I-stat troponin, ED     Status: None   Collection Time: 06/15/18  7:09 AM  Result Value Ref Range   Troponin i, poc 0.00 0.00 - 0.08 ng/mL   Comment 3            Comment: Due to the release kinetics of cTnI, a negative result within the first hours of the onset of symptoms does not rule out myocardial infarction with certainty. If myocardial infarction is still suspected, repeat the test at appropriate intervals.    US Abdomen Limited  Result Date: 06/15/2018 CLINICAL DATA:  Upper abdominal and back pain. Rule out gallstones or gallbladder disease. Initial encounter EXAM: ULTRASOUND ABDOMEN LIMITED RIGHT  UPPER QUADRANT COMPARISON:  None. FINDINGS: Gallbladder: Multiple shadowing mobile stones are present within the gallbladder. The largest measures 1.1 cm. There is no gallbladder wall thickening or sonographic Murphy sign to suggest acute cholecystitis. Common bile duct: Diameter: The common bile duct is dilated up to 10 mm. Possible 1.2 cm stone is present in the common bile duct at the level the pancreas. The area is difficult to see well due to patient body habitus. This may explain the duct dilation. Liver: The liver is diffusely hyperechoic. No focal lesions are present. A poorly defined area of relative sparing is noted in the left lobe. Portal vein is patent on color Doppler imaging with normal direction of blood flow towards the liver. IMPRESSION: 1. Dilated common bile duct with probable obstructing distal stone. 2. Multiple additional shadowing stones evident within the gallbladder without evidence for acute cholecystitis. Electronically Signed   By: San Morelle M.D.   On: 06/15/2018 08:13    Pending Labs Unresulted Labs (From admission, onward)    Start     Ordered   06/15/18 1157  SARS Coronavirus 2 (CEPHEID - Performed in [facility] hospital lab), Hosp Order  (Asymptomatic Patients Labs)  Once,   R    Question:  Rule Out  Answer:  Yes   06/15/18 1156   06/15/18 0703  Pregnancy, urine  ONCE - STAT,   STAT     06/15/18 0703   Signed and Held  HIV antibody (Routine Testing)  Once,   R     Signed and Held   Signed and Held  Comprehensive metabolic panel  Tomorrow morning,   R     Signed and Held   Signed and Held  CBC  Tomorrow morning,   R     Signed and Held          Vitals/Pain Today's Vitals   06/15/18 1030 06/15/18 1130 06/15/18 1130 06/15/18 1149  BP: 110/82 (!) 100/49    Pulse: (!) 101 89    Resp: 18 (!) 24    Temp:      TempSrc:      SpO2: 98% 99%    Weight:      Height:      PainSc:   0-No pain 0-No pain    Isolation Precautions No active  isolations  Medications Medications  HYDROmorphone (DILAUDID) injection 1 mg (  1 mg Intravenous Given 06/15/18 0714)  ondansetron (ZOFRAN) injection 4 mg (4 mg Intravenous Given 06/15/18 0817)  sodium chloride 0.9 % bolus 1,000 mL (0 mLs Intravenous Stopped 06/15/18 0955)  HYDROmorphone (DILAUDID) injection 1 mg (1 mg Intravenous Given 06/15/18 0952)  sodium chloride 0.9 % bolus 1,000 mL (1,000 mLs Intravenous New Bag/Given 06/15/18 1047)    Mobility walks Low fall risk   Focused Assessments   R Recommendations: See Admitting Provider Note  Report given to:   Additional Notes:

## 2018-06-15 NOTE — Consult Note (Signed)
Referring Provider: Dr. Lyman Bishop  Primary Care Physician:  Patient, No Pcp Per Primary Gastroenterologist:  Althia Forts   Reason for Consultation: epigastric pain, RUQ pain  HPI: Michelle Lawson is a 23 y.o. female history of morbid obesity. She awakened at midnight 06/15/2018 with upper middle back pain which radiated between her breasts. She took Advil '200mg'$  two tabs and went back to sleep. She awakened at 4am with nausea, vomited partially digested food. She ate grilled chicken and salad from [employer] at 10pm. Her upper back pain persisted and then developed epigastric pain which radiated to the RUQ. She denies ever having this type of pain. She reports having reflux for the past few months. She was prescribed Omeprazole by her PCP which she took for a few weeks then stopped. Never had an EGD. LMP 05/27/2018, she denies chance of pregnancy.   ED course: Sodium 137.  Potassium 3.6.  Glucose 174.  BUN 10.  Creatinine 0.58.  Alk phos 55.  Albumin 3.7.  Lipase 29.  AST 26.  ALT 19.  Total bilirubin 0.4.  WBC 14.4.  Hemoglobin 11.8.  Hematocrit 37.4.  Platelets 333. Urine HCG negative.   Abdominal sonogram: Multiple shadowing mobile stones present within the gallbladder, the largest measuring 1.1 cm.  No gallbladder wall thickening or evidence of acute cholecystitis.  The common bile duct is dilated up to 10 mm, possible 1.2 stone in the common bile duct at the level of the pancreas.  This area is difficult to assess due to the patient's habitus.  The liver is diffusely hyperechoic.  Portal vein is patent.   Prior to Admission medications   Medication Sig Start Date End Date Taking? Authorizing Provider  fluconazole (DIFLUCAN) 200 MG tablet Take 200 mg by mouth once a week. 06/14/18  Yes [provider]  benzonatate (TESSALON) 100 MG capsule Take 1 capsule (100 mg total) by mouth every 8 (eight) hours. 12/02/17   Caccavale, Sophia, PA-C  chlorpheniramine-HYDROcodone (TUSSIONEX PENNKINETIC  ER) 10-8 MG/5ML SUER Take 5 mLs by mouth at bedtime as needed for cough. 12/02/17   Caccavale, Sophia, PA-C  FLUoxetine (PROZAC) 40 MG capsule Take 40 mg by mouth daily.    [provider]    No current facility-administered medications for this encounter.    Current Outpatient Medications  Medication Sig Dispense Refill  . fluconazole (DIFLUCAN) 200 MG tablet Take 200 mg by mouth once a week.    . benzonatate (TESSALON) 100 MG capsule Take 1 capsule (100 mg total) by mouth every 8 (eight) hours. 21 capsule 0  . chlorpheniramine-HYDROcodone (TUSSIONEX PENNKINETIC ER) 10-8 MG/5ML SUER Take 5 mLs by mouth at bedtime as needed for cough. 140 mL 0  . FLUoxetine (PROZAC) 40 MG capsule Take 40 mg by mouth daily.      Allergies as of 06/15/2018  . (No Known Allergies)    Family History  Problem Relation Age of Onset  . Diabetes Mother   . Stroke Mother   . Diabetes Father     Social History   Socioeconomic History  . Marital status: Single    Spouse name: Not on file  . Number of children: Not on file  . Years of education: Not on file  . Highest education level: Not on file  Occupational History  . Not on file  Social Needs  . Financial resource strain: Not on file  . Food insecurity:    Worry: Not on file    Inability: Not on file  .  Transportation needs:    Medical: Not on file    Non-medical: Not on file  Tobacco Use  . Smoking status: Never Smoker  . Smokeless tobacco: Never Used  Substance and Sexual Activity  . Alcohol use: Yes  . Drug use: No  . Sexual activity: Not on file  Lifestyle  . Physical activity:    Days per week: Not on file    Minutes per session: Not on file  . Stress: Not on file  Relationships  . Social connections:    Talks on phone: Not on file    Gets together: Not on file    Attends religious service: Not on file    Active member of club or organization: Not on file    Attends meetings of clubs or organizations: Not on file     Relationship status: Not on file  . Intimate partner violence:    Fear of current or ex partner: Not on file    Emotionally abused: Not on file    Physically abused: Not on file    Forced sexual activity: Not on file  Other Topics Concern  . Not on file  Social History Narrative  . Not on file    Review of Systems: Gen: No weight loss, fever, sweats or chills. CV: See HPI. No palpitations or dizziness. Resp: No cough or SOB. GI: See HPI GU No dysuria or hematuria. LMP 05/27/2018. Urine HCG negative.  MS: Denies joint pain or weakness.  Psych: No current depression or anxiety.  Heme: Denies bruising, bleeding, and enlarged lymph nodes. Neuro:  Denies any headaches, dizziness, paresthesias. Endo:  Denies any problems with DM, thyroid, adrenal function.  Physical Exam: Vital signs in last 24 hours: Temp:  [98 F (36.7 C)] 98 F (36.7 C) (05/16 0648) Pulse Rate:  [81-105] 101 (05/16 1200) Resp:  [13-29] 29 (05/16 1200) BP: (100-141)/(49-119) 112/78 (05/16 1200) SpO2:  [98 %-100 %] 100 % (05/16 1200) Weight:  [111.1 kg] 111.1 kg (05/16 0649)   General:   Alert,  Well-developed, well-nourished, pleasant and cooperative in NAD Head:  Normocephalic and atraumatic. Eyes:  Sclera clear, no icterus. Conjunctiva pink. Ears:  Normal auditory acuity. Nose:  No deformity, discharge,  or lesions. Mouth:  No deformity or lesions.   Neck:  Supple. Lungs:  Clear throughout to auscultation.   No wheezes, crackles, or rhonchi. Heart:  Regular rate and rhythm; no murmurs, clicks, rubs,  or gallops. Abdomen:    Rectal:  Deferred  Msk:  Symmetrical without gross deformities. . Pulses:  Normal pulses noted. Extremities:  Without clubbing or edema. Neurologic:  Alert and  oriented x4;  grossly normal neurologically. Skin:  Intact without significant lesions or rashes.. Psych:  Alert and cooperative. Normal mood and affect.  Intake/Output from previous day: No intake/output data recorded.  Intake/Output this shift: No intake/output data recorded.  Lab Results: Recent Labs    06/15/18 0700  WBC 14.4*  HGB 11.8*  HCT 37.4  PLT 333   BMET Recent Labs    06/15/18 0700  NA 137  K 3.6  CL 102  CO2 20*  GLUCOSE 174*  BUN 10  CREATININE 0.58  CALCIUM 9.3   LFT Recent Labs    06/15/18 0700  PROT 6.9  ALBUMIN 3.7  AST 26  ALT 19  ALKPHOS 55  BILITOT 0.4    Studies/Results: US Abdomen Limited  Result Date: 06/15/2018 CLINICAL DATA:  Upper abdominal and back pain. Rule out gallstones or gallbladder disease.  Initial encounter EXAM: ULTRASOUND ABDOMEN LIMITED RIGHT UPPER QUADRANT COMPARISON:  None. FINDINGS: Gallbladder: Multiple shadowing mobile stones are present within the gallbladder. The largest measures 1.1 cm. There is no gallbladder wall thickening or sonographic Murphy sign to suggest acute cholecystitis. Common bile duct: Diameter: The common bile duct is dilated up to 10 mm. Possible 1.2 cm stone is present in the common bile duct at the level the pancreas. The area is difficult to see well due to patient body habitus. This may explain the duct dilation. Liver: The liver is diffusely hyperechoic. No focal lesions are present. A poorly defined area of relative sparing is noted in the left lobe. Portal vein is patent on color Doppler imaging with normal direction of blood flow towards the liver. IMPRESSION: 1. Dilated common bile duct with probable obstructing distal stone. 2. Multiple additional shadowing stones evident within the gallbladder without evidence for acute cholecystitis. Electronically Signed   By: San Morelle M.D.   On: 06/15/2018 08:13    IMPRESSION/PLAN  1. 23 y.o. female with epigastric and RUQ abdomian pain. Abdominal sonogram shows gallstones and choledocholithiasis at the level of the pancreas. LFTs and Lipase WNLs. WBC 14.4. She is afebrile. Started on Zosyn IV for possible cholangitis? -MRCP  -will need ERCP if  cholidocholithiasis confirmed by MRCP -pain management per hospitalist   -NPO - IVF  -continue Zosyn for now -CBC with diff, CMP and Lipase in am 5/17  2. Hx Reflux, never had EGD. Stopped taking Omeprazole.  -Pantoprazole '40mg'$  IV QD -may require future EGD -No NSAIDs for now  Further recommendations per Dr. Corena Pilgrim Dorathy Daft  06/15/2018, 12:20 PM

## 2018-06-16 ENCOUNTER — Inpatient Hospital Stay (HOSPITAL_COMMUNITY): Payer: BLUE CROSS/BLUE SHIELD | Admitting: Anesthesiology

## 2018-06-16 ENCOUNTER — Inpatient Hospital Stay (HOSPITAL_COMMUNITY): Payer: BLUE CROSS/BLUE SHIELD

## 2018-06-16 ENCOUNTER — Encounter (HOSPITAL_COMMUNITY): Admission: EM | Disposition: A | Discharge status: Home / Self Care | Payer: Self-pay | Source: Home / Self Care

## 2018-06-16 DIAGNOSIS — D134 Benign neoplasm of liver: Secondary | ICD-10-CM

## 2018-06-16 HISTORY — PX: CHOLECYSTECTOMY: SHX55

## 2018-06-16 LAB — COMPREHENSIVE METABOLIC PANEL
ALT: 14 U/L (ref 0–44)
AST: 42 U/L — ABNORMAL HIGH (ref 15–41)
Albumin: 3 g/dL — ABNORMAL LOW (ref 3.5–5.0)
Alkaline Phosphatase: 47 U/L (ref 38–126)
Anion gap: 12 (ref 5–15)
BUN: 7 mg/dL (ref 6–20)
CO2: 22 mmol/L (ref 22–32)
Calcium: 8.3 mg/dL — ABNORMAL LOW (ref 8.9–10.3)
Chloride: 104 mmol/L (ref 98–111)
Creatinine, Ser: 0.67 mg/dL (ref 0.44–1.00)
GFR calc Af Amer: >60 mL/min (ref >60)
GFR calc non Af Amer: >60 mL/min (ref >60)
Glucose, Bld: 128 mg/dL — ABNORMAL HIGH (ref 70–99)
Potassium: 4 mmol/L (ref 3.5–5.1)
Sodium: 138 mmol/L (ref 135–145)
Total Bilirubin: 1.5 mg/dL — ABNORMAL HIGH (ref 0.3–1.2)
Total Protein: 5.9 g/dL — ABNORMAL LOW (ref 6.5–8.1)

## 2018-06-16 LAB — CBC
HCT: 34.5 % — ABNORMAL LOW (ref 36.0–46.0)
Hemoglobin: 11 g/dL — ABNORMAL LOW (ref 12.0–15.0)
MCH: 26 pg (ref 26.0–34.0)
MCHC: 31.9 g/dL (ref 30.0–36.0)
MCV: 81.6 fL (ref 80.0–100.0)
Platelets: 294 10*3/uL (ref 150–400)
RBC: 4.23 MIL/uL (ref 3.87–5.11)
RDW: 12.8 % (ref 11.5–15.5)
WBC: 12.7 10*3/uL — ABNORMAL HIGH (ref 4.0–10.5)
nRBC: 0 % (ref 0.0–0.2)

## 2018-06-16 LAB — SURGICAL PCR SCREEN
MRSA, PCR: NEGATIVE
Staphylococcus aureus: NEGATIVE

## 2018-06-16 LAB — HIV ANTIBODY (ROUTINE TESTING W REFLEX): HIV Screen 4th Generation wRfx: NONREACTIVE

## 2018-06-16 SURGERY — LAPAROSCOPIC CHOLECYSTECTOMY WITH INTRAOPERATIVE CHOLANGIOGRAM
Anesthesia: General | Site: Abdomen

## 2018-06-16 MED ORDER — DOCUSATE SODIUM 100 MG PO CAPS
100.0000 mg | ORAL_CAPSULE | Freq: Two times a day (BID) | ORAL | Status: DC
  Administered 2018-06-16 – 2018-06-17 (×3): 100 mg via ORAL
  Filled 2018-06-16 (×3): qty 1

## 2018-06-16 MED ORDER — ONDANSETRON HCL 4 MG/2ML IJ SOLN
INTRAMUSCULAR | Status: DC | PRN
  Administered 2018-06-16: 4 mg via INTRAVENOUS

## 2018-06-16 MED ORDER — DEXTROSE-NACL 5-0.9 % IV SOLN
INTRAVENOUS | Status: DC
  Administered 2018-06-16 – 2018-06-17 (×2): via INTRAVENOUS

## 2018-06-16 MED ORDER — ROCURONIUM BROMIDE 10 MG/ML (PF) SYRINGE
PREFILLED_SYRINGE | INTRAVENOUS | Status: AC
  Filled 2018-06-16: qty 10

## 2018-06-16 MED ORDER — KETOROLAC TROMETHAMINE 30 MG/ML IJ SOLN
INTRAMUSCULAR | Status: DC | PRN
  Administered 2018-06-16: 30 mg via INTRAVENOUS

## 2018-06-16 MED ORDER — FENTANYL CITRATE (PF) 100 MCG/2ML IJ SOLN
25.0000 ug | INTRAMUSCULAR | Status: DC | PRN
  Administered 2018-06-16: 50 ug via INTRAVENOUS

## 2018-06-16 MED ORDER — BUPIVACAINE-EPINEPHRINE (PF) 0.25% -1:200000 IJ SOLN
INTRAMUSCULAR | Status: AC
  Filled 2018-06-16: qty 30

## 2018-06-16 MED ORDER — SUCCINYLCHOLINE CHLORIDE 200 MG/10ML IV SOSY
PREFILLED_SYRINGE | INTRAVENOUS | Status: DC | PRN
  Administered 2018-06-16: 120 mg via INTRAVENOUS

## 2018-06-16 MED ORDER — FENTANYL CITRATE (PF) 250 MCG/5ML IJ SOLN
INTRAMUSCULAR | Status: AC
  Filled 2018-06-16: qty 5

## 2018-06-16 MED ORDER — MIDAZOLAM HCL 2 MG/2ML IJ SOLN
INTRAMUSCULAR | Status: DC | PRN
  Administered 2018-06-16: 2 mg via INTRAVENOUS

## 2018-06-16 MED ORDER — LIDOCAINE 2% (20 MG/ML) 5 ML SYRINGE
INTRAMUSCULAR | Status: DC | PRN
  Administered 2018-06-16: 100 mg via INTRAVENOUS

## 2018-06-16 MED ORDER — LACTATED RINGERS IV SOLN
INTRAVENOUS | Status: DC | PRN
  Administered 2018-06-16: 13:00:00 via INTRAVENOUS

## 2018-06-16 MED ORDER — PHENYLEPHRINE 40 MCG/ML (10ML) SYRINGE FOR IV PUSH (FOR BLOOD PRESSURE SUPPORT)
PREFILLED_SYRINGE | INTRAVENOUS | Status: AC
  Filled 2018-06-16: qty 10

## 2018-06-16 MED ORDER — FENTANYL CITRATE (PF) 250 MCG/5ML IJ SOLN
INTRAMUSCULAR | Status: DC | PRN
  Administered 2018-06-16 (×5): 50 ug via INTRAVENOUS

## 2018-06-16 MED ORDER — PROPOFOL 10 MG/ML IV BOLUS
INTRAVENOUS | Status: AC
  Filled 2018-06-16: qty 20

## 2018-06-16 MED ORDER — SODIUM CHLORIDE 0.9 % IR SOLN
Status: DC | PRN
  Administered 2018-06-16: 1000 mL

## 2018-06-16 MED ORDER — ACETAMINOPHEN 10 MG/ML IV SOLN
INTRAVENOUS | Status: AC
  Filled 2018-06-16: qty 100

## 2018-06-16 MED ORDER — ENOXAPARIN SODIUM 40 MG/0.4ML ~~LOC~~ SOLN
40.0000 mg | SUBCUTANEOUS | Status: DC
  Administered 2018-06-16: 40 mg via SUBCUTANEOUS
  Filled 2018-06-16: qty 0.4

## 2018-06-16 MED ORDER — SODIUM CHLORIDE 0.9 % IR SOLN: Status: DC | PRN

## 2018-06-16 MED ORDER — SUCCINYLCHOLINE CHLORIDE 200 MG/10ML IV SOSY
PREFILLED_SYRINGE | INTRAVENOUS | Status: AC
  Filled 2018-06-16: qty 10

## 2018-06-16 MED ORDER — LORAZEPAM 2 MG/ML IJ SOLN
1.0000 mg | Freq: Once | INTRAMUSCULAR | Status: AC
  Administered 2018-06-16: 1 mg via INTRAVENOUS
  Filled 2018-06-16: qty 1

## 2018-06-16 MED ORDER — DEXAMETHASONE SODIUM PHOSPHATE 10 MG/ML IJ SOLN
INTRAMUSCULAR | Status: DC | PRN
  Administered 2018-06-16: 10 mg via INTRAVENOUS

## 2018-06-16 MED ORDER — ACETAMINOPHEN 10 MG/ML IV SOLN
INTRAVENOUS | Status: DC | PRN
  Administered 2018-06-16: 1000 mg via INTRAVENOUS

## 2018-06-16 MED ORDER — LIDOCAINE 2% (20 MG/ML) 5 ML SYRINGE
INTRAMUSCULAR | Status: AC
  Filled 2018-06-16: qty 5

## 2018-06-16 MED ORDER — ONDANSETRON HCL 4 MG/2ML IJ SOLN: 4.0000 mg | Freq: Once | INTRAMUSCULAR | Status: DC | PRN

## 2018-06-16 MED ORDER — OXYCODONE HCL 5 MG PO TABS
5.0000 mg | ORAL_TABLET | ORAL | Status: DC | PRN
  Administered 2018-06-16: 5 mg via ORAL
  Filled 2018-06-16: qty 1

## 2018-06-16 MED ORDER — PROPOFOL 10 MG/ML IV BOLUS
INTRAVENOUS | Status: DC | PRN
  Administered 2018-06-16: 200 mg via INTRAVENOUS

## 2018-06-16 MED ORDER — MIDAZOLAM HCL 2 MG/2ML IJ SOLN
INTRAMUSCULAR | Status: AC
  Filled 2018-06-16: qty 2

## 2018-06-16 MED ORDER — SUGAMMADEX SODIUM 200 MG/2ML IV SOLN
INTRAVENOUS | Status: DC | PRN
  Administered 2018-06-16: 250 mg via INTRAVENOUS

## 2018-06-16 MED ORDER — HYDROMORPHONE HCL 1 MG/ML IJ SOLN
1.0000 mg | INTRAMUSCULAR | Status: DC | PRN
  Administered 2018-06-16 – 2018-06-17 (×2): 1 mg via INTRAVENOUS
  Filled 2018-06-16 (×2): qty 1

## 2018-06-16 MED ORDER — 0.9 % SODIUM CHLORIDE (POUR BTL) OPTIME
TOPICAL | Status: DC | PRN
  Administered 2018-06-16: 1000 mL

## 2018-06-16 MED ORDER — FENTANYL CITRATE (PF) 100 MCG/2ML IJ SOLN
INTRAMUSCULAR | Status: AC
  Filled 2018-06-16: qty 2

## 2018-06-16 MED ORDER — ONDANSETRON HCL 4 MG/2ML IJ SOLN
INTRAMUSCULAR | Status: AC
  Filled 2018-06-16: qty 2

## 2018-06-16 MED ORDER — DEXAMETHASONE SODIUM PHOSPHATE 10 MG/ML IJ SOLN
INTRAMUSCULAR | Status: AC
  Filled 2018-06-16: qty 1

## 2018-06-16 MED ORDER — ROCURONIUM BROMIDE 10 MG/ML (PF) SYRINGE
PREFILLED_SYRINGE | INTRAVENOUS | Status: DC | PRN
  Administered 2018-06-16: 50 mg via INTRAVENOUS

## 2018-06-16 MED ORDER — ACETAMINOPHEN 325 MG PO TABS
650.0000 mg | ORAL_TABLET | Freq: Four times a day (QID) | ORAL | Status: DC
  Administered 2018-06-16 – 2018-06-17 (×2): 650 mg via ORAL
  Filled 2018-06-16 (×3): qty 2

## 2018-06-16 MED ORDER — BUPIVACAINE-EPINEPHRINE 0.25% -1:200000 IJ SOLN
INTRAMUSCULAR | Status: DC | PRN
  Administered 2018-06-16: 20 mL

## 2018-06-16 MED ORDER — GADOBUTROL 1 MMOL/ML IV SOLN
10.0000 mL | Freq: Once | INTRAVENOUS | Status: AC | PRN
  Administered 2018-06-16: 10 mL via INTRAVENOUS

## 2018-06-16 MED ORDER — SODIUM CHLORIDE 0.9 % IV SOLN
INTRAVENOUS | Status: DC | PRN
  Administered 2018-06-16: 14:00:00 8 mL

## 2018-06-16 SURGICAL SUPPLY — 40 items
APPLIER CLIP 5 13 M/L LIGAMAX5 (MISCELLANEOUS) ×2
BANDAGE ADH SHEER 1  50/CT (GAUZE/BANDAGES/DRESSINGS) ×6 IMPLANT
BENZOIN TINCTURE PRP APPL 2/3 (GAUZE/BANDAGES/DRESSINGS) ×2 IMPLANT
CANISTER SUCT 3000ML PPV (MISCELLANEOUS) ×2 IMPLANT
CHLORAPREP W/TINT 26ML (MISCELLANEOUS) ×2 IMPLANT
CLIP APPLIE 5 13 M/L LIGAMAX5 (MISCELLANEOUS) ×1 IMPLANT
COVER MAYO STAND STRL (DRAPES) ×2 IMPLANT
COVER SURGICAL LIGHT HANDLE (MISCELLANEOUS) ×2 IMPLANT
DRAPE C-ARM 42X72 X-RAY (DRAPES) ×2 IMPLANT
DRSG TEGADERM 4X4.75 (GAUZE/BANDAGES/DRESSINGS) ×2 IMPLANT
ELECT REM PT RETURN 9FT ADLT (ELECTROSURGICAL) ×2
ELECTRODE REM PT RTRN 9FT ADLT (ELECTROSURGICAL) ×1 IMPLANT
GAUZE SPONGE 2X2 8PLY NS (GAUZE/BANDAGES/DRESSINGS) ×2 IMPLANT
GLOVE BIOGEL M STRL SZ7.5 (GLOVE) ×2 IMPLANT
GLOVE INDICATOR 8.0 STRL GRN (GLOVE) ×2 IMPLANT
GOWN STRL REUS W/ TWL LRG LVL3 (GOWN DISPOSABLE) ×2 IMPLANT
GOWN STRL REUS W/ TWL XL LVL3 (GOWN DISPOSABLE) ×1 IMPLANT
GOWN STRL REUS W/TWL LRG LVL3 (GOWN DISPOSABLE) ×2
GOWN STRL REUS W/TWL XL LVL3 (GOWN DISPOSABLE) ×1
GRASPER SUT TROCAR 14GX15 (MISCELLANEOUS) ×2 IMPLANT
KIT BASIN OR (CUSTOM PROCEDURE TRAY) ×2 IMPLANT
KIT TURNOVER KIT B (KITS) ×2 IMPLANT
NS IRRIG 1000ML POUR BTL (IV SOLUTION) ×2 IMPLANT
PAD ARMBOARD 7.5X6 YLW CONV (MISCELLANEOUS) ×2 IMPLANT
POUCH RETRIEVAL ECOSAC 10 (ENDOMECHANICALS) ×1 IMPLANT
POUCH RETRIEVAL ECOSAC 10MM (ENDOMECHANICALS) ×1
SCISSORS LAP 5X35 DISP (ENDOMECHANICALS) ×2 IMPLANT
SET CHOLANGIOGRAPH 5 50 .035 (SET/KITS/TRAYS/PACK) ×2 IMPLANT
SET IRRIG TUBING LAPAROSCOPIC (IRRIGATION / IRRIGATOR) ×2 IMPLANT
SET TUBE SMOKE EVAC HIGH FLOW (TUBING) ×2 IMPLANT
SLEEVE ENDOPATH XCEL 5M (ENDOMECHANICALS) ×4 IMPLANT
SPECIMEN JAR SMALL (MISCELLANEOUS) ×2 IMPLANT
STRIP CLOSURE SKIN 1/2X4 (GAUZE/BANDAGES/DRESSINGS) ×2 IMPLANT
SUT MNCRL AB 4-0 PS2 18 (SUTURE) ×2 IMPLANT
SUT VICRYL 0 UR6 27IN ABS (SUTURE) ×2 IMPLANT
TOWEL OR 17X24 6PK STRL BLUE (TOWEL DISPOSABLE) ×2 IMPLANT
TRAY LAPAROSCOPIC MC (CUSTOM PROCEDURE TRAY) ×2 IMPLANT
TROCAR XCEL BLUNT TIP 100MML (ENDOMECHANICALS) ×2 IMPLANT
TROCAR XCEL NON-BLD 5MMX100MML (ENDOMECHANICALS) ×2 IMPLANT
WATER STERILE IRR 1000ML POUR (IV SOLUTION) ×2 IMPLANT

## 2018-06-16 NOTE — Op Note (Addendum)
Michelle Lawson [id number] Dec 17, 1995 06/16/2018  Laparoscopic Cholecystectomy with IOC Procedure Note  Indications: This patient presents with symptomatic gallbladder disease and will undergo laparoscopic cholecystectomy.  On admission the patient had a dilated common bile duct with ultrasound suggestive of a common bile duct stone.  On hospital day 2 her liver function test bumped.  GI was consulted.  MRI was ordered which demonstrated no evidence of choledocholithiasis.  I therefore recommended proceeding to the operating room for cholecystectomy.  Pre-operative Diagnosis: acute calculous cholecystitis  Post-operative Diagnosis: Same  Surgeon: Greer Pickerel MD FACS  Assistants: none  Anesthesia: General endotracheal anesthesia  Procedure Details  The patient was seen again in the Holding Room. The risks, benefits, complications, treatment options, and expected outcomes were discussed with the patient. The possibilities of reaction to medication, pulmonary aspiration, perforation of viscus, bleeding, recurrent infection, finding a normal gallbladder, the need for additional procedures, failure to diagnose a condition, the possible need to convert to an open procedure, and creating a complication requiring transfusion or operation were discussed with the patient. The likelihood of improving the patient's symptoms with return to their baseline status is good.  The patient and/or family concurred with the proposed plan, giving informed consent. The site of surgery properly noted. The patient was taken to Operating Room, identified as Michelle Lawson and the procedure verified as Laparoscopic Cholecystectomy with Intraoperative Cholangiogram. A Time Out was held and the above information confirmed. Antibiotic prophylaxis was administered.   Prior to the induction of general anesthesia, antibiotic prophylaxis was administered. General endotracheal anesthesia was then administered and tolerated well.  After the induction, the abdomen was prepped with Chloraprep and draped in the sterile fashion. The patient was positioned in the supine position.  Local anesthetic agent was injected into the skin near the umbilicus and an incision made. We dissected down to the abdominal fascia with blunt dissection.  The fascia was incised vertically and we entered the peritoneal cavity bluntly.  A pursestring suture of 0-Vicryl was placed around the fascial opening.  The Hasson cannula was inserted and secured with the stay suture.  Pneumoperitoneum was then created with CO2 and tolerated well without any adverse changes in the patient's vital signs. An 5-mm port was placed in the subxiphoid position.  Two 5-mm ports were placed in the right upper quadrant. All skin incisions were infiltrated with a local anesthetic agent before making the incision and placing the trocars.   We positioned the patient in reverse Trendelenburg, tilted slightly to the patient's left.  The gallbladder was identified.  It was edematous and distended.  In order to facilitate retraction I aspirated the gallbladder. the fundus grasped and retracted cephalad. Adhesions were lysed bluntly and with the electrocautery where indicated, taking care not to injure any adjacent organs or viscus. The infundibulum was grasped and retracted laterally, exposing the peritoneum overlying the triangle of Calot. This was then divided and exposed in a blunt fashion. A critical view of the cystic duct and cystic artery was obtained.  The cystic duct was clearly identified and bluntly dissected circumferentially. The cystic duct was ligated with a clip distally.   An incision was made in the cystic duct and the [facility] cholangiogram catheter introduced. The catheter was secured using a clip. A cholangiogram was then obtained which showed good visualization of the distal and proximal biliary tree with no sign of filling defects or obstruction.  Contrast flowed easily into  the duodenum. The catheter was then removed.   The cystic  duct was then ligated with clips and divided. The cystic artery which had been identified & dissected free was ligated with clips and divided as well.   The gallbladder was dissected from the liver bed in retrograde fashion with the electrocautery.  There was edema in the gallbladder wall. the gallbladder was removed and placed in an Ecco sac.  The gallbladder and Ecco sac were then removed through the umbilical port site. The liver bed was irrigated and inspected. Hemostasis was achieved with the electrocautery. Copious irrigation was utilized and was repeatedly aspirated until clear.  The pursestring suture was used to close the umbilical fascia.    We again inspected the right upper quadrant for hemostasis.  The umbilical closure was inspected and there was no air leak and nothing trapped within the closure.  Given her obesity I did elect to place an additional interrupted 0 Vicryl suture at the umbilical fascia using a 0 Vicryl with the PMI suture passer with laparoscopic assistance.  Pneumoperitoneum was released as we removed the trocars.  4-0 Monocryl was used to close the skin.   Benzoin, steri-strips, and clean dressings were applied. The patient was then extubated and brought to the recovery room in stable condition. Instrument, sponge, and needle counts were correct at closure and at the conclusion of the case.   Findings: Cholecystitis with Cholelithiasis Dilated hepatic and common bile duct.  Prompt emptying of contrast into the duodenum.  No evidence of a filling defect. Positive critical view  23 minute discussion with her parents over the telephone about postoperative findings, postoperative care and the finding of a 1.4 cm segment 3 liver mass consistent with hepatic adenoma.  Recommended follow-up MRI in 6 months. Estimated Blood Loss: Minimal         Drains: none         Specimens: Gallbladder           Complications:  None; patient tolerated the procedure well.         Disposition: PACU - hemodynamically stable.         Condition: stable  Leighton Ruff. Redmond Pulling, MD, FACS General, Bariatric, & Minimally Invasive Surgery [facility], [place]

## 2018-06-16 NOTE — Progress Notes (Signed)
Subjective No acute events. Feeling much better. Minimal RUQ pain now. Denies n/v. MRCP pending  Objective: Vital signs in last 24 hours: Temp:  [98.4 F (36.9 C)-98.6 F (37 C)] 98.6 F (37 C) (05/17 0421) Pulse Rate:  [83-103] 83 (05/17 0421) Resp:  [14-29] 17 (05/17 0421) BP: (100-133)/(49-119) 122/71 (05/17 0421) SpO2:  [98 %-100 %] 100 % (05/17 0421) Last BM Date: 06/14/18  Intake/Output from previous day: 05/16 0701 - 05/17 0700 In: 1238.6 [I.V.:1138.5; IV Piggyback:100] Out: -  Intake/Output this shift: No intake/output data recorded.  Gen: NAD, comfortable CV: RRR Pulm: Normal work of breathing Abd: Soft, NT/ND Ext: SCDs in place  Lab Results: CBC  Recent Labs    06/15/18 0700 06/16/18 0220  WBC 14.4* 12.7*  HGB 11.8* 11.0*  HCT 37.4 34.5*  PLT 333 294   BMET Recent Labs    06/15/18 0700 06/16/18 0220  NA 137 138  K 3.6 4.0  CL 102 104  CO2 20* 22  GLUCOSE 174* 128*  BUN 10 7  CREATININE 0.58 0.67  CALCIUM 9.3 8.3*   PT/INR No results for input(s): LABPROT, INR in the last 72 hours. ABG No results for input(s): PHART, HCO3 in the last 72 hours.  Invalid input(s): PCO2, PO2  Studies/Results:  Anti-infectives: Anti-infectives (From admission, onward)   Start     Dose/Rate Route Frequency Ordered Stop   06/15/18 1315  piperacillin-tazobactam (ZOSYN) IVPB 3.375 g     3.375 g 12.5 mL/hr over 240 Minutes Intravenous Every 8 hours 06/15/18 1256         Assessment/Plan: Patient Active Problem List   Diagnosis Date Noted  . Gallstones 06/15/2018   Michelle Lawson is a 69yoF with likely choledocholithiasis  -MRCP today; possible ERCP with GI -NPO, MIVF -IV Zosyn -Anticipate cholecystectomy this admission -We discussed plan of care, relevant anatomy/phys/pathophysiology today and rational. Her and her families questions were answered to their satisfaction and they expressed understanding.   LOS: 1 day   Sharon Mt. Dema Severin,  M.D. [facility]

## 2018-06-16 NOTE — Anesthesia Procedure Notes (Signed)
Procedure Name: Intubation Date/Time: 06/16/2018 1:10 PM Performed by: Clearnce Sorrel, CRNA Pre-anesthesia Checklist: Patient identified, Emergency Drugs available, Suction available, Patient being monitored and Timeout performed Patient Re-evaluated:Patient Re-evaluated prior to induction Oxygen Delivery Method: Circle system utilized Preoxygenation: Pre-oxygenation with 100% oxygen Induction Type: IV induction and Rapid sequence Laryngoscope Size: Mac and 3 Grade View: Grade I Tube type: Oral Tube size: 7.0 mm Number of attempts: 1 Airway Equipment and Method: Stylet Placement Confirmation: ETT inserted through vocal cords under direct vision,  positive ETCO2 and breath sounds checked- equal and bilateral Secured at: 23 cm Tube secured with: Tape Dental Injury: Teeth and Oropharynx as per pre-operative assessment

## 2018-06-16 NOTE — Transfer of Care (Signed)
Immediate Anesthesia Transfer of Care Note  Patient: Michelle Lawson  Procedure(s) Performed: LAPAROSCOPIC CHOLECYSTECTOMY WITH INTRAOPERATIVE CHOLANGIOGRAM (N/A Abdomen)  Patient Location: PACU  Anesthesia Type:General  Level of Consciousness: awake and alert   Airway & Oxygen Therapy: Patient Spontanous Breathing  Post-op Assessment: Report given to RN  Post vital signs: Reviewed  Last Vitals:  Vitals Value Taken Time  BP    Temp    Pulse    Resp    SpO2      Last Pain:  Vitals:   06/16/18 1534  TempSrc: Oral  PainSc:       Patients Stated Pain Goal: 4 (04/79/98 7215)  Complications: No apparent anesthesia complications

## 2018-06-16 NOTE — Anesthesia Postprocedure Evaluation (Signed)
Anesthesia Post Note  Patient: Michelle Lawson  Procedure(s) Performed: LAPAROSCOPIC CHOLECYSTECTOMY WITH INTRAOPERATIVE CHOLANGIOGRAM (N/A Abdomen)     Patient location during evaluation: PACU Anesthesia Type: General Level of consciousness: awake and alert Pain management: pain level controlled Vital Signs Assessment: post-procedure vital signs reviewed and stable Respiratory status: spontaneous breathing, nonlabored ventilation, respiratory function stable and patient connected to nasal cannula oxygen Cardiovascular status: blood pressure returned to baseline and stable Postop Assessment: no apparent nausea or vomiting Anesthetic complications: no    Last Vitals:  Vitals:   06/16/18 1520 06/16/18 1534  BP: 127/69 130/70  Pulse: (!) 102 (!) 102  Resp: (!) 26   Temp:  36.5 C  SpO2: 93% 96%    Last Pain:  Vitals:   06/16/18 1534  TempSrc: Oral  PainSc:                  Tiajuana Amass

## 2018-06-16 NOTE — Interval H&P Note (Signed)
History and Physical Interval Note:  06/16/2018 12:26 PM  Fiona Coto  has presented today for surgery, with the diagnosis of cholecystitis.  The various methods of treatment have been discussed with the patient and family. After consideration of risks, benefits and other options for treatment, the patient has consented to  Procedure(s): LAPAROSCOPIC CHOLECYSTECTOMY WITH INTRAOPERATIVE CHOLANGIOGRAM (N/A) as a surgical intervention.  The patient's history has been reviewed, patient examined, no change in status, stable for surgery.  I have reviewed the patient's chart and labs.  Questions were answered to the patient's satisfaction.    MRI negative for CBD stones Will proceed with lap chole with ioc  I believe the patient's symptoms are consistent with gallbladder disease.  We discussed gallbladder disease. The patient was given Neurosurgeon. We discussed non-operative and operative management. We discussed the signs & symptoms of acute cholecystitis  I discussed laparoscopic cholecystectomy with IOC in detail.   We discussed the risks and benefits of a laparoscopic cholecystectomy including, but not limited to bleeding, infection, injury to surrounding structures such as the intestine or liver, bile leak, retained gallstones, need to convert to an open procedure, prolonged diarrhea, blood clots such as  DVT, common bile duct injury, anesthesia risks, and possible need for additional procedures.  We discussed the typical post-operative recovery course. I explained that the likelihood of improvement of their symptoms is good.  Also discussed hepatic adenoma seen on MRI and need for f/u with her PCP in 6 mo for repeat imaging  Greer Pickerel

## 2018-06-16 NOTE — H&P (View-Only) (Signed)
Subjective No acute events. Feeling much better. Minimal RUQ pain now. Denies n/v. MRCP pending  Objective: Vital signs in last 24 hours: Temp:  [98.4 F (36.9 C)-98.6 F (37 C)] 98.6 F (37 C) (05/17 0421) Pulse Rate:  [83-103] 83 (05/17 0421) Resp:  [14-29] 17 (05/17 0421) BP: (100-133)/(49-119) 122/71 (05/17 0421) SpO2:  [98 %-100 %] 100 % (05/17 0421) Last BM Date: 06/14/18  Intake/Output from previous day: 05/16 0701 - 05/17 0700 In: 1238.6 [I.V.:1138.5; IV Piggyback:100] Out: -  Intake/Output this shift: No intake/output data recorded.  Gen: NAD, comfortable CV: RRR Pulm: Normal work of breathing Abd: Soft, NT/ND Ext: SCDs in place  Lab Results: CBC  Recent Labs    06/15/18 0700 06/16/18 0220  WBC 14.4* 12.7*  HGB 11.8* 11.0*  HCT 37.4 34.5*  PLT 333 294   BMET Recent Labs    06/15/18 0700 06/16/18 0220  NA 137 138  K 3.6 4.0  CL 102 104  CO2 20* 22  GLUCOSE 174* 128*  BUN 10 7  CREATININE 0.58 0.67  CALCIUM 9.3 8.3*   PT/INR No results for input(s): LABPROT, INR in the last 72 hours. ABG No results for input(s): PHART, HCO3 in the last 72 hours.  Invalid input(s): PCO2, PO2  Studies/Results:  Anti-infectives: Anti-infectives (From admission, onward)   Start     Dose/Rate Route Frequency Ordered Stop   06/15/18 1315  piperacillin-tazobactam (ZOSYN) IVPB 3.375 g     3.375 g 12.5 mL/hr over 240 Minutes Intravenous Every 8 hours 06/15/18 1256         Assessment/Plan: Patient Active Problem List   Diagnosis Date Noted  . Gallstones 06/15/2018   Michelle Lawson is a 65yoF with likely choledocholithiasis  -MRCP today; possible ERCP with GI -NPO, MIVF -IV Zosyn -Anticipate cholecystectomy this admission -We discussed plan of care, relevant anatomy/phys/pathophysiology today and rational. Her and her families questions were answered to their satisfaction and they expressed understanding.   LOS: 1 day   Sharon Mt. Dema Severin,  M.D. [facility]

## 2018-06-16 NOTE — Anesthesia Preprocedure Evaluation (Signed)
Anesthesia Evaluation  Patient identified by MRN, date of birth, ID band Patient awake    Reviewed: Allergy & Precautions, NPO status , Patient's Chart, lab work & pertinent test results  Airway Mallampati: II  TM Distance: >3 FB     Dental  (+) Dental Advisory Given   Pulmonary neg pulmonary ROS,    breath sounds clear to auscultation- rhonchi       Cardiovascular negative cardio ROS   Rhythm:Regular Rate:Normal     Neuro/Psych negative neurological ROS     GI/Hepatic negative GI ROS, Neg liver ROS,   Endo/Other  Morbid obesity  Renal/GU negative Renal ROS     Musculoskeletal   Abdominal   Peds  Hematology negative hematology ROS (+)   Anesthesia Other Findings   Reproductive/Obstetrics                             Lab Results  Component Value Date   WBC 12.7 (H) 06/16/2018   HGB 11.0 (L) 06/16/2018   HCT 34.5 (L) 06/16/2018   MCV 81.6 06/16/2018   PLT 294 06/16/2018   Lab Results  Component Value Date   CREATININE 0.67 06/16/2018   BUN 7 06/16/2018   NA 138 06/16/2018   K 4.0 06/16/2018   CL 104 06/16/2018   CO2 22 06/16/2018    Anesthesia Physical Anesthesia Plan  ASA: II  Anesthesia Plan: General   Post-op Pain Management:    Induction: Intravenous  PONV Risk Score and Plan: 3 and Dexamethasone, Ondansetron, Treatment may vary due to age or medical condition and Midazolam  Airway Management Planned: Oral ETT  Additional Equipment:   Intra-op Plan:   Post-operative Plan: Extubation in OR  Informed Consent: I have reviewed the patients History and Physical, chart, labs and discussed the procedure including the risks, benefits and alternatives for the proposed anesthesia with the patient or authorized representative who has indicated his/her understanding and acceptance.     Dental advisory given  Plan Discussed with: CRNA  Anesthesia Plan Comments:          Anesthesia Quick Evaluation

## 2018-06-16 NOTE — Progress Notes (Signed)
Brief GI progress note  Patient MRI/MRCP has not been completed as of this morning. I saw the patient and offered her and her sister the consideration of an EUS/ERCP combination.  They deferred on this. I spoke with MRI this morning and updated the order to be MRI/MRCP with and without contrast. They assured me that she would be done this morning. If the MRI CP shows evidence of choledocholithiasis we will see if we are able to add her on for today versus being placed on for tomorrow. The patient and sister are aware of the plan. The risks of an ERCP were discussed at length, including but not limited to the risk of perforation, bleeding, abdominal pain, post-ERCP pancreatitis (while usually mild can be severe and even life threatening). The risks and benefits of endoscopic evaluation were discussed with the patient; these include but are not limited to the risk of perforation, infection, bleeding, missed lesions, lack of diagnosis, severe illness requiring hospitalization, as well as anesthesia and sedation related illnesses.  The patient is agreeable to proceed if it is felt indicated to move forward with an ERCP. We will await the results of MRI/MRCP. The patient will remain n.p.o.   Justice Britain, MD [facility] Advanced Endoscopy Office # [phone]

## 2018-06-17 ENCOUNTER — Encounter (HOSPITAL_COMMUNITY): Payer: Self-pay | Admitting: General Surgery

## 2018-06-17 MED ORDER — ACETAMINOPHEN 500 MG PO TABS
1000.0000 mg | ORAL_TABLET | Freq: Three times a day (TID) | ORAL | Status: DC
  Administered 2018-06-17: 1000 mg via ORAL
  Filled 2018-06-17: qty 2

## 2018-06-17 MED ORDER — OXYCODONE HCL 5 MG PO TABS: 5.0000 mg | ORAL_TABLET | ORAL | 0 refills | Status: DC | PRN

## 2018-06-17 MED ORDER — IBUPROFEN 200 MG PO TABS: 400.0000 mg | ORAL_TABLET | Freq: Four times a day (QID) | ORAL | Status: AC | PRN

## 2018-06-17 MED ORDER — ACETAMINOPHEN 500 MG PO TABS: 1000.0000 mg | ORAL_TABLET | Freq: Three times a day (TID) | ORAL | Status: AC | PRN

## 2018-06-17 MED ORDER — IBUPROFEN 600 MG PO TABS
600.0000 mg | ORAL_TABLET | Freq: Four times a day (QID) | ORAL | Status: DC
  Administered 2018-06-17: 600 mg via ORAL
  Filled 2018-06-17 (×2): qty 1

## 2018-06-17 NOTE — Plan of Care (Signed)
  Problem: Pain Managment: Goal: General experience of comfort will improve Outcome: Progressing   Problem: Safety: Goal: Ability to remain free from injury will improve Outcome: Progressing   Problem: Skin Integrity: Goal: Risk for impaired skin integrity will decrease Outcome: Progressing   

## 2018-06-17 NOTE — Discharge Instructions (Signed)
[facility] °LAPAROSCOPIC SURGERY: POST OP INSTRUCTIONS °Always review your discharge instruction sheet given to you by the facility where your surgery was performed. °IF YOU HAVE DISABILITY OR FAMILY LEAVE FORMS, YOU MUST BRING THEM TO THE OFFICE FOR PROCESSING.   °DO NOT GIVE THEM TO YOUR DOCTOR. ° °PAIN CONTROL ° °1. First take acetaminophen (Tylenol) AND/or ibuprofen (Advil) to control your pain after surgery.  Follow directions on package.  Taking acetaminophen (Tylenol) and/or ibuprofen (Advil) regularly after surgery will help to control your pain and lower the amount of prescription pain medication you may need.  You should not take more than 3,000 mg (3 grams) of acetaminophen (Tylenol) in 24 hours.  You should not take ibuprofen (Advil), aleve, motrin, naprosyn or other NSAIDS if you have a history of stomach ulcers or chronic kidney disease.  °2. A prescription for pain medication may be given to you upon discharge.  Take your pain medication as prescribed, if you still have uncontrolled pain after taking acetaminophen (Tylenol) or ibuprofen (Advil). °3. Use ice packs to help control pain. °4. If you need a refill on your pain medication, please contact your pharmacy.  They will contact our office to request authorization. Prescriptions will not be filled after 5pm or on week-ends. ° °HOME MEDICATIONS °5. Take your usually prescribed medications unless otherwise directed. ° °DIET °6. You should follow a light diet the first few days after arrival home.  Be sure to include lots of fluids daily. Avoid fatty, fried foods.  ° °CONSTIPATION °7. It is common to experience some constipation after surgery and if you are taking pain medication.  Increasing fluid intake and taking a stool softener (such as Colace) will usually help or prevent this problem from occurring.  A mild laxative (Milk of Magnesia or Miralax) should be taken according to package instructions if there are no bowel  movements after 48 hours. ° °WOUND/INCISION CARE °8. Most patients will experience some swelling and bruising in the area of the incisions.  Ice packs will help.  Swelling and bruising can take several days to resolve.  °9. Unless discharge instructions indicate otherwise, follow guidelines below  °a. STERI-STRIPS - you may remove your outer bandages 48 hours after surgery, and you may shower at that time.  You have steri-strips (small skin tapes) in place directly over the incision.  These strips should be left on the skin for 7-10 days.   °b. DERMABOND/SKIN GLUE - you may shower in 24 hours.  The glue will flake off over the next 2-3 weeks. °10. Any sutures or staples will be removed at the office during your follow-up visit. ° °ACTIVITIES °11. You may resume regular (light) daily activities beginning the next day--such as daily self-care, walking, climbing stairs--gradually increasing activities as tolerated.  You may have sexual intercourse when it is comfortable.  Refrain from any heavy lifting or straining until approved by your doctor. °a. You may drive when you are no longer taking prescription pain medication, you can comfortably wear a seatbelt, and you can safely maneuver your car and apply brakes. ° °FOLLOW-UP °12. You should see your doctor in the office for a follow-up appointment approximately 2-3 weeks after your surgery.  You should have been given your post-op/follow-up appointment when your surgery was scheduled.  If you did not receive a post-op/follow-up appointment, make sure that you call for this appointment within a day or two after you arrive home to insure a convenient appointment time. ° °OTHER   INSTRUCTIONS 13.   WHEN TO CALL YOUR DOCTOR: 1. Fever over 101.0 2. Inability to urinate 3. Continued bleeding from incision. 4. Increased pain, redness, or drainage from the incision. 5. Increasing abdominal pain  The clinic staff is available to answer your questions during regular  business hours.  Please dont hesitate to call and ask to speak to one of the nurses for clinical concerns.  If you have a medical emergency, go to the nearest emergency room or call 911.  A surgeon from [facility] is always on call at the hospital. [address], [place], [place], [place]  [address] ? [address], [place], [place]   [address] [phone] ? [phone] ? FAX [phone] Web site: [email or web address]     Managing Your Pain After Surgery Without Opioids    Thank you for participating in our program to help patients manage their pain after surgery without opioids. This is part of our effort to provide you with the best care possible, without exposing you or your family to the risk that opioids pose.  What pain can I expect after surgery? You can expect to have some pain after surgery. This is normal. The pain is typically worse the day after surgery, and quickly begins to get better. Many studies have found that many patients are able to manage their pain after surgery with Over-the-Counter (OTC) medications such as Tylenol and Motrin. If you have a condition that does not allow you to take Tylenol or Motrin, notify your surgical team.  How will I manage my pain? The best strategy for controlling your pain after surgery is around the clock pain control with Tylenol (acetaminophen) and Motrin (ibuprofen or Advil). Alternating these medications with each other allows you to maximize your pain control. In addition to Tylenol and Motrin, you can use heating pads or ice packs on your incisions to help reduce your pain.  How will I alternate your regular strength over-the-counter pain medication? You will take a dose of pain medication every three hours. ; Start by taking 650 mg of Tylenol (2 pills of 325 mg) ; 3 hours later take 600 mg of Motrin (3 pills of 200 mg) ; 3 hours after taking the Motrin take 650 mg of Tylenol ; 3 hours after that  take 600 mg of Motrin.   - 1 -  See example - if your first dose of Tylenol is at 12:00 PM   12:00 PM Tylenol 650 mg (2 pills of 325 mg)  3:00 PM Motrin 600 mg (3 pills of 200 mg)  6:00 PM Tylenol 650 mg (2 pills of 325 mg)  9:00 PM Motrin 600 mg (3 pills of 200 mg)  Continue alternating every 3 hours   We recommend that you follow this schedule around-the-clock for at least 3 days after surgery, or until you feel that it is no longer needed. Use the table on the last page of this handout to keep track of the medications you are taking. Important: Do not take more than 3000mg  of Tylenol or 2300mg  of Motrin in a 24-hour period. Do not take ibuprofen/Motrin if you have a history of bleeding stomach ulcers, severe kidney disease, &/or actively taking a blood thinner  What if I still have pain? If you have pain that is not controlled with the over-the-counter pain medications (Tylenol and Motrin or Advil) you might have what we call breakthrough pain. You will receive a prescription for a small amount of an opioid  pain medication such as Oxycodone, Tramadol, or Tylenol with Codeine. Use these opioid pills in the first 24 hours after surgery if you have breakthrough pain. Do not take more than 1 pill every 4-6 hours.  If you still have uncontrolled pain after using all opioid pills, don't hesitate to call our staff using the number provided. We will help make sure you are managing your pain in the best way possible, and if necessary, we can provide a prescription for additional pain medication.   Day 1    Time  Name of Medication Number of pills taken  Amount of Acetaminophen  Pain Level   Comments  AM PM       AM PM       AM PM       AM PM       AM PM       AM PM       AM PM       AM PM       Total Daily amount of Acetaminophen Do not take more than  3,000 mg per day      Day 2    Time  Name of Medication Number of pills taken  Amount of Acetaminophen  Pain Level    Comments  AM PM       AM PM       AM PM       AM PM       AM PM       AM PM       AM PM       AM PM       Total Daily amount of Acetaminophen Do not take more than  3,000 mg per day      Day 3    Time  Name of Medication Number of pills taken  Amount of Acetaminophen  Pain Level   Comments  AM PM       AM PM       AM PM       AM PM          AM PM       AM PM       AM PM       AM PM       Total Daily amount of Acetaminophen Do not take more than  3,000 mg per day      Day 4    Time  Name of Medication Number of pills taken  Amount of Acetaminophen  Pain Level   Comments  AM PM       AM PM       AM PM       AM PM       AM PM       AM PM       AM PM       AM PM       Total Daily amount of Acetaminophen Do not take more than  3,000 mg per day      Day 5    Time  Name of Medication Number of pills taken  Amount of Acetaminophen  Pain Level   Comments  AM PM       AM PM       AM PM       AM PM       AM PM       AM PM  AM PM       AM PM       Total Daily amount of Acetaminophen Do not take more than  3,000 mg per day       Day 6    Time  Name of Medication Number of pills taken  Amount of Acetaminophen  Pain Level  Comments  AM PM       AM PM       AM PM       AM PM       AM PM       AM PM       AM PM       AM PM       Total Daily amount of Acetaminophen Do not take more than  3,000 mg per day      Day 7    Time  Name of Medication Number of pills taken  Amount of Acetaminophen  Pain Level   Comments  AM PM       AM PM       AM PM       AM PM       AM PM       AM PM       AM PM       AM PM       Total Daily amount of Acetaminophen Do not take more than  3,000 mg per day        For additional information about how and where to safely dispose of unused opioid medications - [employer]  Disclaimer: This document contains information and/or instructional materials adapted from  [place] for the typical patient with your condition. It does not replace medical advice from your health care provider because your experience may differ from that of the typical patient. Talk to your health care provider if you have any questions about this document, your condition or your treatment plan. Adapted from [place]

## 2018-06-17 NOTE — Discharge Summary (Signed)
[facility] Discharge Summary   Patient ID: Michelle Lawson MRN: [id number] DOB/AGE: [birth year] 25 y.o.  Admit date: 06/15/2018 Discharge date: 06/17/2018  Admitting Diagnosis: Choledocholithiasis  Discharge Diagnosis Acute cholecystitis Hepatic Adenoma   Consultants Gastroenterology  Imaging: Dg Cholangiogram Operative  Result Date: 06/16/2018 CLINICAL DATA:  Intraoperative cholangiogram during laparoscopic cholecystectomy. EXAM: INTRAOPERATIVE CHOLANGIOGRAM FLUOROSCOPY TIME:  7 seconds COMPARISON:  MRCP-06/16/2018 FINDINGS: Intraoperative cholangiographic images of the right upper abdominal quadrant during laparoscopic cholecystectomy are provided for review. Surgical clips overlie the expected location of the gallbladder fossa. Contrast injection demonstrates selective cannulation of the central aspect of the cystic duct. There is passage of contrast through the central aspect of the cystic duct with filling of a mildly dilated common bile duct. There is passage of contrast though the CBD and into the descending portion of the duodenum. There is minimal reflux of injected contrast into the common hepatic duct and central aspect of the non dilated intrahepatic biliary system. There are no discrete filling defects within the opacified portions of the biliary system to suggest the presence of choledocholithiasis. IMPRESSION: No evidence of choledocholithiasis. Electronically Signed   By: Sandi Mariscal M.D.   On: 06/16/2018 14:48   Mr 3d Recon At Scanner  Result Date: 06/16/2018 CLINICAL DATA:  Obstruction of bile duct EXAM: MRI ABDOMEN WITHOUT AND WITH CONTRAST (INCLUDING MRCP) TECHNIQUE: Multiplanar multisequence MR imaging of the abdomen was performed both before and after the administration of intravenous contrast. Heavily T2-weighted images of the biliary and pancreatic ducts were obtained, and three-dimensional MRCP images were rendered by post processing. CONTRAST:  10 mL  Gadovist IV COMPARISON:  Right upper quadrant ultrasound dated 06/15/2018 FINDINGS: Motion degraded images. Lower chest: Lung bases are clear. Hepatobiliary: Moderate geographic hepatic steatosis. 14 mm lesion in segment 3 (series 1601/image 67), stealthy on T2, with signal loss on opposed phase imaging suggesting intracellular lipid (series 1202/image 62). Progressive central to peripheral wash-in, isointense to hepatic parenchyma on later phases of enhancement. No central scar. While technically indeterminate, this favors a benign hepatic adenoma over a benign FNH. Cholelithiasis (series 10/image 36), with mild gallbladder wall thickening/edema. No evidence of acute cholecystitis. No intrahepatic ductal dilatation. Common duct measures 8 mm, mildly prominent, although smoothly tapering at the ampulla. No choledocholithiasis is seen. Pancreas:  Within normal limits. Spleen:  Within normal limits. Adrenals/Urinary Tract:  Adrenal glands are within normal limits. Kidneys are within normal limits.  No hydronephrosis. Stomach/Bowel: Stomach is within normal limits. Visualized bowel is unremarkable. Vascular/Lymphatic:  No evidence abdominal aortic aneurysm. No suspicious abdominal lymphadenopathy. Other:  No abdominal ascites. Musculoskeletal: No focal osseous lesions. IMPRESSION: Cholelithiasis, without evidence of acute cholecystitis. Common duct is mildly prominent, measuring 8 mm. No choledocholithiasis is seen. 14 mm lesion in segment 3, favoring a benign hepatic adenoma over a benign FNH. 25-month follow-up MRI abdomen with/without Eovist contrast is suggested. If patient is on oral contraceptives, consider cessation prior to repeat imaging. Electronically Signed   By: Julian Hy M.D.   On: 06/16/2018 11:36   Mr Abdomen Mrcp Moise Boring Contast  Result Date: 06/16/2018 CLINICAL DATA:  Obstruction of bile duct EXAM: MRI ABDOMEN WITHOUT AND WITH CONTRAST (INCLUDING MRCP) TECHNIQUE: Multiplanar multisequence MR  imaging of the abdomen was performed both before and after the administration of intravenous contrast. Heavily T2-weighted images of the biliary and pancreatic ducts were obtained, and three-dimensional MRCP images were rendered by post processing. CONTRAST:  10 mL Gadovist IV COMPARISON:  Right upper quadrant ultrasound dated  06/15/2018 FINDINGS: Motion degraded images. Lower chest: Lung bases are clear. Hepatobiliary: Moderate geographic hepatic steatosis. 14 mm lesion in segment 3 (series 1601/image 67), stealthy on T2, with signal loss on opposed phase imaging suggesting intracellular lipid (series 1202/image 62). Progressive central to peripheral wash-in, isointense to hepatic parenchyma on later phases of enhancement. No central scar. While technically indeterminate, this favors a benign hepatic adenoma over a benign FNH. Cholelithiasis (series 10/image 36), with mild gallbladder wall thickening/edema. No evidence of acute cholecystitis. No intrahepatic ductal dilatation. Common duct measures 8 mm, mildly prominent, although smoothly tapering at the ampulla. No choledocholithiasis is seen. Pancreas:  Within normal limits. Spleen:  Within normal limits. Adrenals/Urinary Tract:  Adrenal glands are within normal limits. Kidneys are within normal limits.  No hydronephrosis. Stomach/Bowel: Stomach is within normal limits. Visualized bowel is unremarkable. Vascular/Lymphatic:  No evidence abdominal aortic aneurysm. No suspicious abdominal lymphadenopathy. Other:  No abdominal ascites. Musculoskeletal: No focal osseous lesions. IMPRESSION: Cholelithiasis, without evidence of acute cholecystitis. Common duct is mildly prominent, measuring 8 mm. No choledocholithiasis is seen. 14 mm lesion in segment 3, favoring a benign hepatic adenoma over a benign FNH. 25-month follow-up MRI abdomen with/without Eovist contrast is suggested. If patient is on oral contraceptives, consider cessation prior to repeat imaging.  Electronically Signed   By: Julian Hy M.D.   On: 06/16/2018 11:36    Procedures Dr. Redmond Pulling (06/16/18) - Laparoscopic Cholecystectomy with [place] Hospital Course:  Patient is a 23 year old female who presented to [facility] with RUQ pain.  Workup showed possible choledocholithiasis.  Patient was admitted and GI consulted, MRI done to evaluate for choledocholithiasis. MRI was negative and patient was taken to the OR for laparoscopic cholecystectomy. Tolerated procedure well and was transferred to the floor.  Diet was advanced as tolerated.  On POD#1, the patient was voiding well, tolerating diet, ambulating well, pain well controlled, vital signs stable, incisions c/d/i and felt stable for discharge home.  Patient will follow up with our office in 2 weeks and knows to call with questions or concerns. She will call to confirm appointment date/time.    Physical Exam: General:  Alert, NAD, pleasant, comfortable Abd:  Soft, ND, mild tenderness, incisions C/D/I   Allergies as of 06/17/2018   No Known Allergies     Medication List    STOP taking these medications   benzonatate 100 MG capsule Commonly known as:  TESSALON   chlorpheniramine-HYDROcodone 10-8 MG/5ML Suer Commonly known as:  Tussionex Pennkinetic ER     TAKE these medications   acetaminophen 500 MG tablet Commonly known as:  TYLENOL Take 2 tablets (1,000 mg total) by mouth every 8 (eight) hours as needed for mild pain.   fluconazole 200 MG tablet Commonly known as:  DIFLUCAN Take 200 mg by mouth See admin instructions. Take 1 tablet (200 mg totally) by mouth every Friday for 5 weeks   ibuprofen 200 MG tablet Commonly known as:  ADVIL Take 2 tablets (400 mg total) by mouth every 6 (six) hours as needed for headache or moderate pain. What changed:    when to take this  reasons to take this   loratadine 10 MG tablet Commonly known as:  CLARITIN Take 10 mg by mouth 2 (two) times daily.   nystatin cream Commonly known  as:  MYCOSTATIN Apply 1 application topically 2 (two) times daily.   oxyCODONE 5 MG immediate release tablet Commonly known as:  Oxy IR/ROXICODONE Take 1 tablet (5 mg total) by  mouth every 4 (four) hours as needed for severe pain.        Follow-up Information    [facility], [facility]. Call on 07/02/2018.   Specialty:  General Surgery Why:  Telephone visit scheduled for 8:30 AM. A provider will call you during scheduled appointment time. Please send a photo of incisions with name and DOB to [email or web address] the day prior to appointment.  Contact information: [place] [address] [phone]           Signed: Brigid Re, [facility] 06/17/2018, 8:48 AM Pager: [phone] Consults: [phone]

## 2019-01-14 ENCOUNTER — Encounter (HOSPITAL_COMMUNITY): Payer: Self-pay

## 2019-03-06 ENCOUNTER — Ambulatory Visit (HOSPITAL_COMMUNITY): Payer: Medicaid Other

## 2019-03-06 ENCOUNTER — Other Ambulatory Visit: Payer: Self-pay

## 2019-03-31 ENCOUNTER — Ambulatory Visit: Payer: Medicaid Other | Admitting: Obstetrics and Gynecology

## 2019-04-03 ENCOUNTER — Other Ambulatory Visit: Payer: Self-pay

## 2019-04-03 ENCOUNTER — Ambulatory Visit: Payer: Self-pay | Admitting: Student

## 2019-04-03 VITALS — BP 118/63 | Temp 97.1°F | Wt 223.0 lb

## 2019-04-03 DIAGNOSIS — Z1239 Encounter for other screening for malignant neoplasm of breast: Secondary | ICD-10-CM

## 2019-04-03 NOTE — Progress Notes (Signed)
Patient ID: Michelle Lawson, female   DOB: 1995/08/01, 24 y.o.   MRN: [id number] Michelle Lawson is a 24 y.o. female who presents to [facility] clinic today with no complaints.    Pap Smear: Pap not smear completed today. Last Pap smear was 01/14/2019 at [facility] clinic and was abnormal - ASCUS with HR HPV. . Per patient has history of an abnormal Pap smear. Last Pap smear result is available in Epic.   Physical exam: Breasts Breasts symmetrical. No skin abnormalities bilateral breasts. No nipple retraction bilateral breasts. No nipple discharge bilateral breasts. No lymphadenopathy. No lumps palpated bilateral breasts.       Pelvic/Bimanual Pap is not indicated today    Smoking History: Patient has never smoked .   Patient Navigation: Patient education provided. Access to services provided for patient through [facility] program. No  interpreter provided. No transportation provided   Colorectal Cancer Screening: Per patient has never had colonoscopy completed No complaints today.    Breast and Cervical Cancer Risk Assessment: Patient has family history of breast cancer, known genetic mutations, or radiation treatment to the chest before age 36. Patient does not have history of cervical dysplasia, immunocompromised, or DES exposure in-utero.  Risk Assessment    Risk Scores      04/03/2019   Last edited by: Demetrius Revel, LPN   5-year risk:    Lifetime risk:           A: BCCCP exam without pap smear Complaint of nothing.   P: Patient has colposcopy scheduled on Thursday, March 11 at [place] am.    [place], [place] 04/03/2019 2:55 PM

## 2019-04-10 ENCOUNTER — Other Ambulatory Visit: Payer: Self-pay

## 2019-04-10 ENCOUNTER — Encounter: Payer: Self-pay | Admitting: Obstetrics and Gynecology

## 2019-04-10 ENCOUNTER — Other Ambulatory Visit (HOSPITAL_COMMUNITY)
Admission: RE | Admit: 2019-04-10 | Discharge: 2019-04-10 | Disposition: A | Discharge status: Home / Self Care | Payer: Medicaid Other | Source: Ambulatory Visit | Attending: Obstetrics and Gynecology | Admitting: Obstetrics and Gynecology

## 2019-04-10 ENCOUNTER — Ambulatory Visit (INDEPENDENT_AMBULATORY_CARE_PROVIDER_SITE_OTHER): Payer: Self-pay | Admitting: Obstetrics and Gynecology

## 2019-04-10 VITALS — BP 102/77 | HR 87 | Wt 221.4 lb

## 2019-04-10 DIAGNOSIS — N879 Dysplasia of cervix uteri, unspecified: Secondary | ICD-10-CM

## 2019-04-10 DIAGNOSIS — N871 Moderate cervical dysplasia: Secondary | ICD-10-CM

## 2019-04-10 HISTORY — PX: COLPOSCOPY: PRO47

## 2019-04-10 LAB — POCT PREGNANCY, URINE: Preg Test, Ur: NEGATIVE

## 2019-04-10 NOTE — Procedures (Signed)
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   The risks (including infection, bleeding, pain) and benefits of the procedure were explained to the patient and written informed consent was obtained.  The patient was placed in the dorsal lithotomy position. A Graves was speculum inserted in the vagina, and the cervix was visualized.  AA staining done Lugol's with green filter.  Biopsy from 6 o'clock and then single toothed tenaculum applied and no bleeding after procedure with silver nitrate  Findings: diffuse AWE changes, +mosacisim at 6 o'clock  Adequate: yes  Specimens: 6 o'clock cervix  Condition: Stable  Complications: None  Plan: The patient was advised to call for any fever or for prolonged or severe pain or bleeding. She was advised to use OTC analgesics as needed for mild to moderate pain. She was advised to avoid vaginal intercourse for 48 hours or until the bleeding has completely stopped.   [name] MD Attending Center for [employer] [facility])

## 2019-04-14 LAB — SURGICAL PATHOLOGY

## 2019-04-15 ENCOUNTER — Telehealth: Payer: Self-pay | Admitting: *Deleted

## 2019-04-15 NOTE — Telephone Encounter (Signed)
I called Michelle Lawson and informed her of results and recommendations per Dr. Ilda Basset. She voices understanding. Shekita Boyden,RN

## 2019-04-15 NOTE — Telephone Encounter (Signed)
-----   Message from Aletha Halim, MD sent at 04/15/2019 10:33 AM EDT ----- Please let her know that there were some slightly abnormal cells but just recommend a repeat pap smear in a year. thanks

## 2019-06-13 ENCOUNTER — Telehealth: Payer: Self-pay | Admitting: *Deleted

## 2019-06-13 NOTE — Telephone Encounter (Addendum)
Pt left VM message stating that she received a bill and her "procedure should have been free because she went through the steps." Please call back.   [phone]  I called pt and left a message for her to call the number on her bill for Patient Accounting and discuss her concerns with that department. There may be some additional information which is needed from her.

## 2020-06-07 IMAGING — MR MR ABDOMEN WO/W CM MRCP
11 of 23 series · 18 of 48 positions shown · IV contrast (agent unspecified)
Comparison: Right upper quadrant ultrasound dated 06/15/2018

CLINICAL DATA: Obstruction of bile duct

EXAM:
MRI ABDOMEN WITHOUT AND WITH CONTRAST (INCLUDING MRCP)
TECHNIQUE: Multiplanar multisequence MR imaging of the abdomen was performed
both before and after the administration of intravenous contrast.
Heavily T2-weighted images of the biliary and pancreatic ducts were
obtained, and three-dimensional MRCP images were rendered by post
processing.
CONTRAST:  10 mL Gadovist IV

[Series 8: cor ssfse nav · coronal · 6.0mm · 0.78mm/px · 1 of 43 slices shown]
[im 1/43]
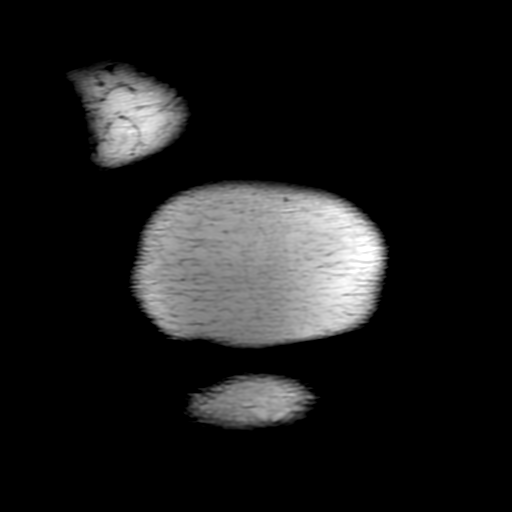

[Series 9: ax ssfse nav · axial · 6.0mm · 0.74mm/px · 1 of 56 slices shown]
[im 1/56]
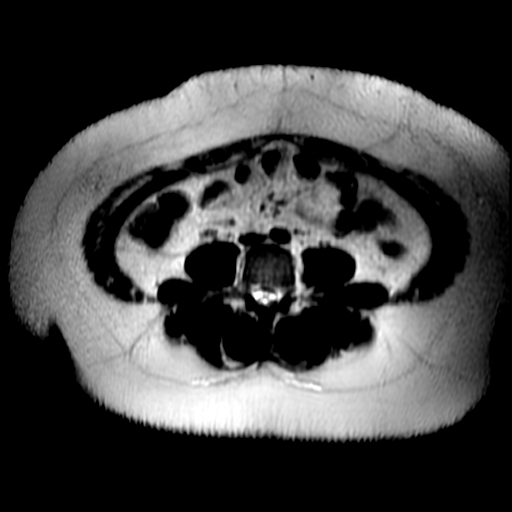

[Series 10: T2 fat-sat · axial · 6.0mm · 0.74mm/px · 1 of 56 slices shown]
[im 1/56]
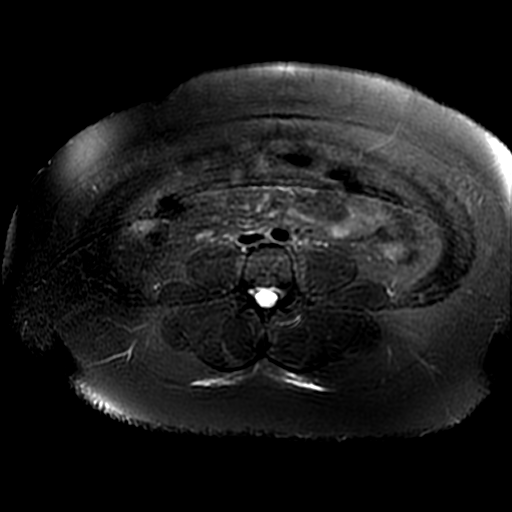

[Series 11: DWI b500 · axial · 8.0mm · 1.48mm/px · 1 of 68 slices shown]
[im 1/68]
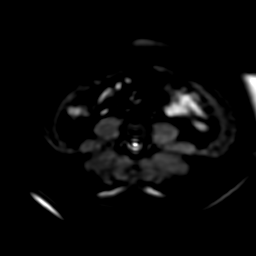

[Series 12: T1 dynamic · axial · 5.0mm · 0.82mm/px · z∈[+119,+455]mm · 2 of 136 slices shown (1 of 5)]
[im 1/136]
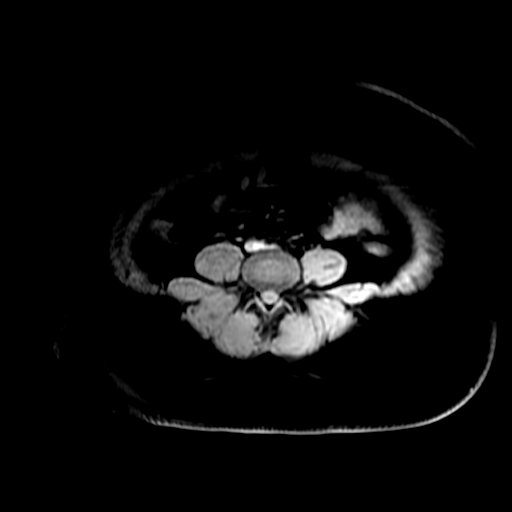
[im 136/136]
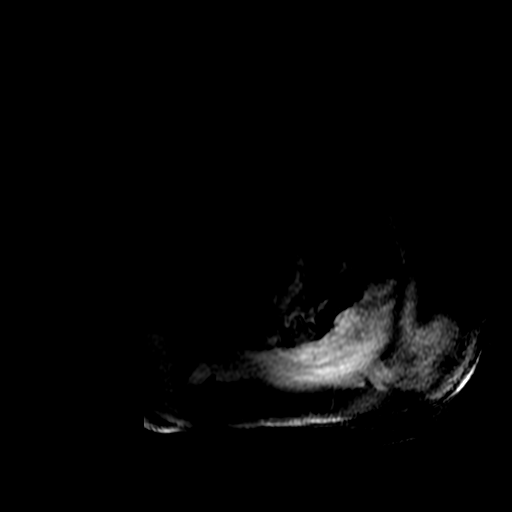

[Series 15: radial 2d thick · coronal · 40.0mm · 0.86mm/px · 1 of 6 slices shown]
[im 1/6]
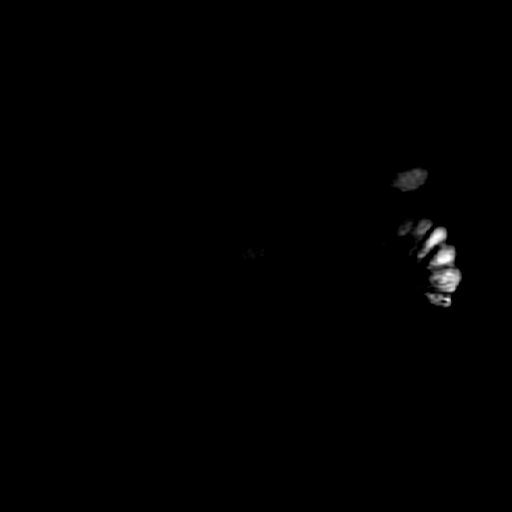

[Series 17: T1 dynamic · coronal · 3.4mm · 1.56mm/px · 2 of 152 slices shown (2 of 5)]
[im 1/152]
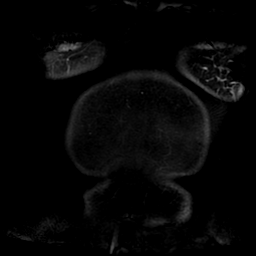
[im 152/152]
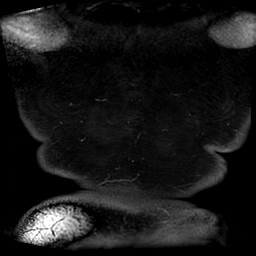

[Series 1150: ADC · axial · 8.0mm · 1.48mm/px · 1 of 34 slices shown]
[im 1/34]
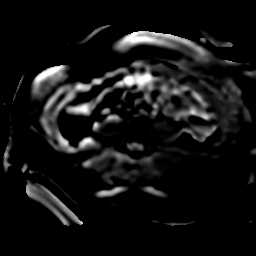

[Series 1200: T1 dynamic · axial · 5.0mm · 0.82mm/px · z∈[+119,+455]mm · 2 of 136 slices shown (3 of 5)]
[im 1/136]
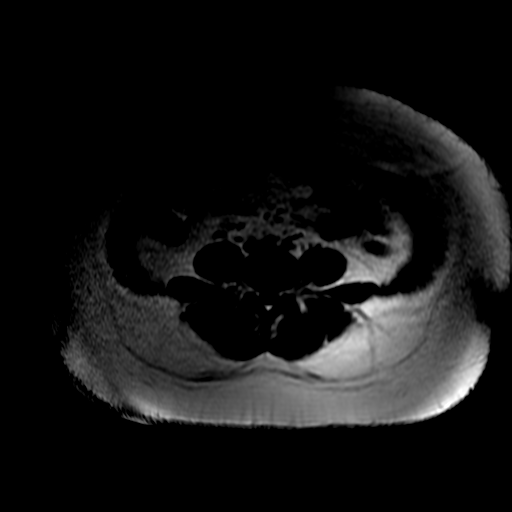
[im 136/136]
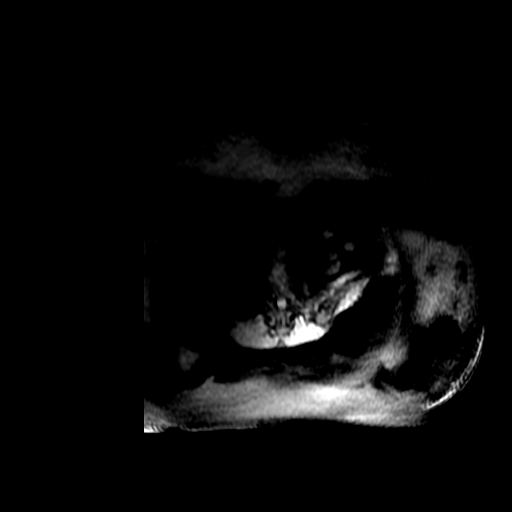

[Series 1201: T1 dynamic · axial · 5.0mm · 0.82mm/px · z∈[+119,+455]mm · 3 of 136 slices shown (4 of 5)]
[im 1/136]
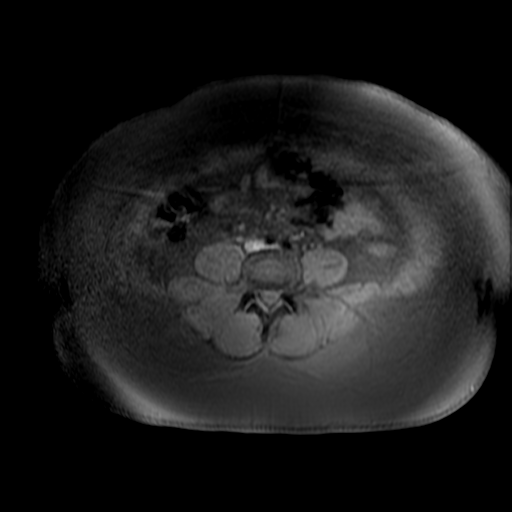
[im 68/136]
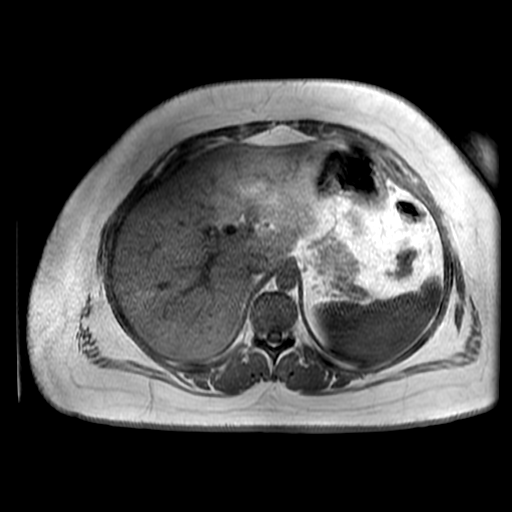
[im 136/136]
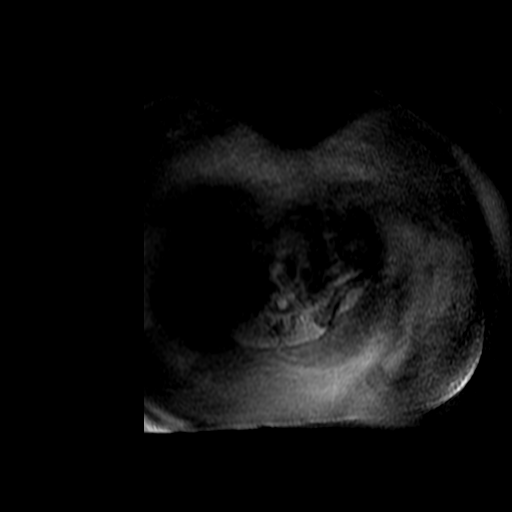

[Series 1202: T1 dynamic · axial · 5.0mm · 0.82mm/px · z∈[+119,+455]mm · 3 of 136 slices shown (5 of 5)]
[im 1/136]
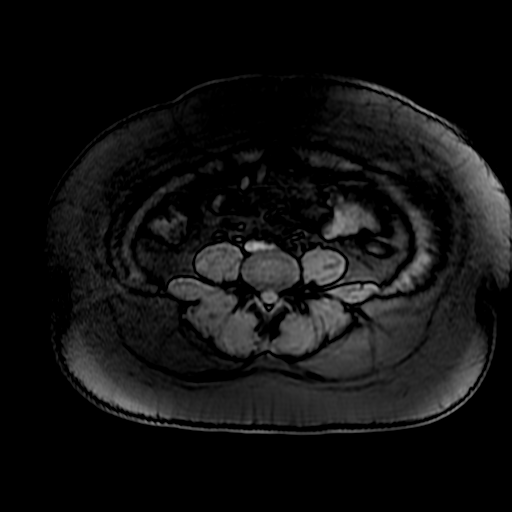
[im 68/136]
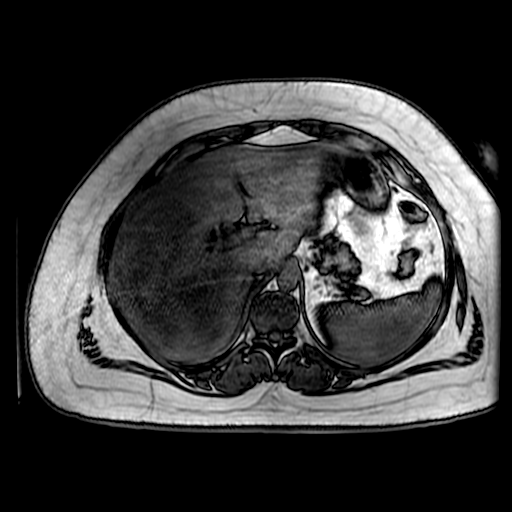
[im 136/136]
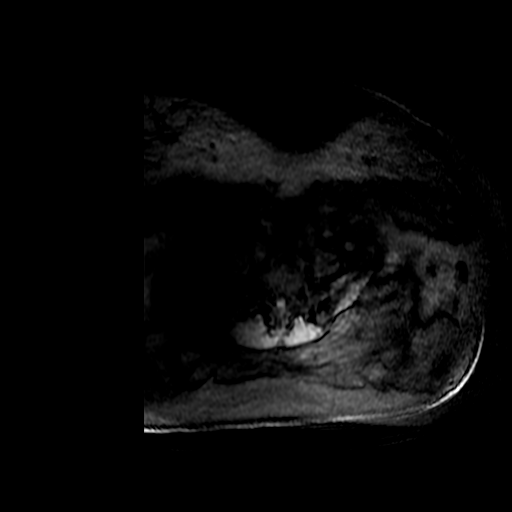

[18 of 48 positions shown; findings below may reference images not displayed]

FINDINGS: Motion degraded images.

Lower chest: Lung bases are clear.

Hepatobiliary: Moderate geographic hepatic steatosis.

14 mm lesion in segment 3 (series 9569/image 67), stealthy on T2,
with signal loss on opposed phase imaging suggesting intracellular
lipid (series 8727/image 62). Progressive central to peripheral
wash-in, isointense to hepatic parenchyma on later phases of
enhancement. No central scar. While technically indeterminate, this
favors a benign hepatic adenoma over a benign FNH.

Cholelithiasis (series 10/image 36), with mild gallbladder wall
thickening/edema. No evidence of acute cholecystitis.

No intrahepatic ductal dilatation. Common duct measures 8 mm, mildly
prominent, although smoothly tapering at the ampulla. No
choledocholithiasis is seen.

Pancreas:  Within normal limits.

Spleen:  Within normal limits.

Adrenals/Urinary Tract:  Adrenal glands are within normal limits.

Kidneys are within normal limits.  No hydronephrosis.

Stomach/Bowel: Stomach is within normal limits.

Visualized bowel is unremarkable.

Vascular/Lymphatic:  No evidence abdominal aortic aneurysm.

No suspicious abdominal lymphadenopathy.

Other:  No abdominal ascites.

Musculoskeletal: No focal osseous lesions.
IMPRESSION: Cholelithiasis, without evidence of acute cholecystitis.

Common duct is mildly prominent, measuring 8 mm. No
choledocholithiasis is seen.

14 mm lesion in segment 3, favoring a benign hepatic adenoma over a
benign FNH. 6-month follow-up MRI abdomen with/without Eovist
contrast is suggested. If patient is on oral contraceptives,
consider cessation prior to repeat imaging.
